# Patient Record
Sex: Female | Born: 1945 | Race: White | Hispanic: No | Marital: Married | Smoking: Never smoker
Health system: Southern US, Community
[De-identification: ages and names within clinical notes are randomized; demographics above are authoritative.]

## PROBLEM LIST (undated history)

## (undated) DIAGNOSIS — M1990 Unspecified osteoarthritis, unspecified site: Secondary | ICD-10-CM

## (undated) DIAGNOSIS — Z803 Family history of malignant neoplasm of breast: Secondary | ICD-10-CM

## (undated) DIAGNOSIS — E0500 Thyrotoxicosis with diffuse goiter without thyrotoxic crisis or storm: Secondary | ICD-10-CM

## (undated) HISTORY — PX: SCLERAL BUCKLE: SHX5340

## (undated) HISTORY — PX: CATARACT EXTRACTION: SUR2

## (undated) HISTORY — PX: TUBAL LIGATION: SHX77

## (undated) HISTORY — DX: Thyrotoxicosis with diffuse goiter without thyrotoxic crisis or storm: E05.00

## (undated) HISTORY — PX: RETINAL DETACHMENT SURGERY: SHX105

## (undated) HISTORY — DX: Family history of malignant neoplasm of breast: Z80.3

---

## 1988-07-13 HISTORY — PX: BREAST EXCISIONAL BIOPSY: SUR124

## 1996-02-15 DIAGNOSIS — D229 Melanocytic nevi, unspecified: Secondary | ICD-10-CM

## 1996-02-15 HISTORY — DX: Melanocytic nevi, unspecified: D22.9

## 1997-08-17 ENCOUNTER — Ambulatory Visit (HOSPITAL_COMMUNITY): Admission: RE | Admit: 1997-08-17 | Discharge: 1997-08-17 | Payer: Self-pay | Admitting: General Surgery

## 1997-08-17 HISTORY — PX: BREAST BIOPSY: SHX20

## 1998-08-22 ENCOUNTER — Other Ambulatory Visit: Admission: RE | Admit: 1998-08-22 | Discharge: 1998-08-22 | Payer: Self-pay | Admitting: Obstetrics and Gynecology

## 1999-08-05 ENCOUNTER — Other Ambulatory Visit: Admission: RE | Admit: 1999-08-05 | Discharge: 1999-08-05 | Payer: Self-pay | Admitting: Obstetrics and Gynecology

## 1999-11-11 ENCOUNTER — Encounter: Admission: RE | Admit: 1999-11-11 | Discharge: 1999-11-11 | Payer: Self-pay | Admitting: Obstetrics and Gynecology

## 1999-11-11 ENCOUNTER — Encounter: Payer: Self-pay | Admitting: Obstetrics and Gynecology

## 2000-05-13 ENCOUNTER — Encounter: Payer: Self-pay | Admitting: Ophthalmology

## 2000-05-13 ENCOUNTER — Ambulatory Visit (HOSPITAL_COMMUNITY): Admission: RE | Admit: 2000-05-13 | Discharge: 2000-05-14 | Payer: Self-pay | Admitting: Ophthalmology

## 2000-08-05 ENCOUNTER — Other Ambulatory Visit: Admission: RE | Admit: 2000-08-05 | Discharge: 2000-08-05 | Payer: Self-pay | Admitting: Obstetrics and Gynecology

## 2000-11-30 ENCOUNTER — Encounter: Payer: Self-pay | Admitting: Obstetrics and Gynecology

## 2000-11-30 ENCOUNTER — Encounter: Admission: RE | Admit: 2000-11-30 | Discharge: 2000-11-30 | Payer: Self-pay | Admitting: Obstetrics and Gynecology

## 2000-12-02 ENCOUNTER — Encounter: Payer: Self-pay | Admitting: General Surgery

## 2000-12-02 ENCOUNTER — Encounter: Admission: RE | Admit: 2000-12-02 | Discharge: 2000-12-02 | Payer: Self-pay | Admitting: General Surgery

## 2000-12-03 ENCOUNTER — Encounter: Payer: Self-pay | Admitting: General Surgery

## 2000-12-03 ENCOUNTER — Encounter: Admission: RE | Admit: 2000-12-03 | Discharge: 2000-12-03 | Payer: Self-pay | Admitting: General Surgery

## 2001-10-04 ENCOUNTER — Other Ambulatory Visit: Admission: RE | Admit: 2001-10-04 | Discharge: 2001-10-04 | Payer: Self-pay | Admitting: Obstetrics and Gynecology

## 2001-12-07 ENCOUNTER — Encounter: Payer: Self-pay | Admitting: Obstetrics and Gynecology

## 2001-12-07 ENCOUNTER — Encounter: Admission: RE | Admit: 2001-12-07 | Discharge: 2001-12-07 | Payer: Self-pay | Admitting: Obstetrics and Gynecology

## 2002-01-05 ENCOUNTER — Ambulatory Visit (HOSPITAL_COMMUNITY): Admission: RE | Admit: 2002-01-05 | Discharge: 2002-01-05 | Payer: Self-pay | Admitting: Gastroenterology

## 2002-06-01 ENCOUNTER — Encounter: Admission: RE | Admit: 2002-06-01 | Discharge: 2002-06-01 | Payer: Self-pay | Admitting: Obstetrics and Gynecology

## 2002-06-01 ENCOUNTER — Encounter: Payer: Self-pay | Admitting: Obstetrics and Gynecology

## 2002-10-10 ENCOUNTER — Other Ambulatory Visit: Admission: RE | Admit: 2002-10-10 | Discharge: 2002-10-10 | Payer: Self-pay | Admitting: Obstetrics and Gynecology

## 2002-11-09 ENCOUNTER — Encounter: Admission: RE | Admit: 2002-11-09 | Discharge: 2002-11-09 | Payer: Self-pay | Admitting: Geriatric Medicine

## 2002-11-09 ENCOUNTER — Encounter: Payer: Self-pay | Admitting: Geriatric Medicine

## 2002-12-15 ENCOUNTER — Encounter: Admission: RE | Admit: 2002-12-15 | Discharge: 2002-12-15 | Payer: Self-pay | Admitting: Obstetrics and Gynecology

## 2002-12-15 ENCOUNTER — Encounter: Payer: Self-pay | Admitting: Obstetrics and Gynecology

## 2003-12-19 ENCOUNTER — Encounter: Admission: RE | Admit: 2003-12-19 | Discharge: 2003-12-19 | Payer: Self-pay | Admitting: Obstetrics and Gynecology

## 2005-01-15 ENCOUNTER — Encounter: Admission: RE | Admit: 2005-01-15 | Discharge: 2005-01-15 | Payer: Self-pay | Admitting: Obstetrics and Gynecology

## 2006-01-19 ENCOUNTER — Ambulatory Visit (HOSPITAL_COMMUNITY): Admission: RE | Admit: 2006-01-19 | Discharge: 2006-01-19 | Payer: Self-pay | Admitting: Obstetrics and Gynecology

## 2006-01-21 ENCOUNTER — Encounter: Admission: RE | Admit: 2006-01-21 | Discharge: 2006-01-21 | Payer: Self-pay | Admitting: Obstetrics and Gynecology

## 2006-06-08 ENCOUNTER — Encounter: Admission: RE | Admit: 2006-06-08 | Discharge: 2006-06-08 | Payer: Self-pay | Admitting: Obstetrics and Gynecology

## 2006-06-11 ENCOUNTER — Encounter: Admission: RE | Admit: 2006-06-11 | Discharge: 2006-06-11 | Payer: Self-pay | Admitting: Geriatric Medicine

## 2007-01-28 ENCOUNTER — Encounter: Admission: RE | Admit: 2007-01-28 | Discharge: 2007-01-28 | Payer: Self-pay | Admitting: Obstetrics and Gynecology

## 2007-08-09 ENCOUNTER — Ambulatory Visit: Payer: Self-pay | Admitting: Oncology

## 2008-02-10 ENCOUNTER — Encounter: Admission: RE | Admit: 2008-02-10 | Discharge: 2008-02-10 | Payer: Self-pay | Admitting: Obstetrics and Gynecology

## 2008-04-05 ENCOUNTER — Encounter: Admission: RE | Admit: 2008-04-05 | Discharge: 2008-04-05 | Payer: Self-pay | Admitting: Geriatric Medicine

## 2009-02-21 ENCOUNTER — Encounter: Admission: RE | Admit: 2009-02-21 | Discharge: 2009-02-21 | Payer: Self-pay | Admitting: Obstetrics and Gynecology

## 2009-10-23 ENCOUNTER — Encounter: Admission: RE | Admit: 2009-10-23 | Discharge: 2009-10-23 | Payer: Self-pay | Admitting: Orthopedic Surgery

## 2009-11-04 ENCOUNTER — Encounter: Admission: RE | Admit: 2009-11-04 | Discharge: 2009-11-04 | Payer: Self-pay | Admitting: Orthopedic Surgery

## 2010-03-04 ENCOUNTER — Encounter: Admission: RE | Admit: 2010-03-04 | Discharge: 2010-03-04 | Payer: Self-pay | Admitting: Geriatric Medicine

## 2010-04-01 ENCOUNTER — Encounter: Admission: RE | Admit: 2010-04-01 | Discharge: 2010-04-01 | Payer: Self-pay | Admitting: Obstetrics and Gynecology

## 2010-08-03 ENCOUNTER — Encounter: Payer: Self-pay | Admitting: Obstetrics and Gynecology

## 2010-11-28 NOTE — Discharge Summary (Signed)
[place]. [facility]  Patient:    NECIA, KAMM                       MRN: [id number] Adm. Date:  [id number] Disc. Date: [id number] Attending:  Ivor Messier CC:         Marcelyn Bruins. Nile Riggs, M.D.   Discharge Summary  This was a planned outpatient admission of this 65 year old white female admitted with chronic and recurrent vitreous hemorrhage associated with retinal tears of the right eye (see detailed admission history and physical).  HOSPITAL COURSE:  The patient was evaluated preoperatively and felt to be in satisfactory condition for the proposed surgery.  She therefore was taken into the operating room where a combined posterior vitrectomy and scleral buckling procedure was performed under general anesthesia without complication.  The patient tolerated the two-hour procedure under general anesthesia and was taken to the recovery room and subsequently to the 23-hour observation unit. The patient was seen on the evening of surgery and the following morning and felt to be doing well.  Inspection of the eye revealed a clear vitreous, retinal attachment with a good superior buckling indentation.  It was felt that the patient had achieved maximal hospital benefit, that she could be discharged from the hospital to be followed in the office on limited activity. The patient and her family were given a printed list of discharge instruction on the care and use of the operated eye.  Discharge ocular medications include Cyclogyl, Cyclomydril ophthalmic solution, Tobradex ophthalmic solution one drop four times a day five minutes apart; Maxitrol and atropine ointment at bedtime.  Condition on discharge improved.  DISCHARGE DIAGNOSIS:  Chronic and recurrent vitreous hemorrhage, left eye. Multiple retinal tears, left eye. DD:  06/14/00 TD:  06/14/00 Job: [id number]

## 2010-11-28 NOTE — Procedures (Signed)
[place]. [facility] [place]  Patient:    Renee Smith, Renee Smith Visit Number: [id number] MRN: [id number]          Service Type: END Location: ENDO Attending Physician:  Dennison Bulla Ii Dictated by:   Verlin Grills, M.D. Proc. Date: 01/05/02 Admit Date:  01/05/2002 Discharge Date: 01/05/2002   CC:         Hal T. Stoneking, M.D.  Sherry A. Rosalio Macadamia, M.D.   Procedure Report  REFERRING PHYSICIANS:  Hal T. Pete Glatter, M.D., Eliberto Ivory. Rosalio Macadamia, M.D.  PROCEDURE:  Colonoscopy.  PROCEDURE INDICATION:  Ms. [name] is a 65 year old female born [birth year].  Ms. [name] has intermittent hematochezia and is undergoing diagnostic colonoscopy.  I discussed with Ms. [name] the complications associated with colonoscopy and polypectomy, including a 15 per thousand risk of bleeding and four per thousand risk of colon perforation.  Ms. [name] has signed the permit.  ENDOSCOPIST:  Verlin Grills, M.D.  PREMEDICATION:  Versed 10 mg, Demerol 50 mg.  ENDOSCOPE:  Olympus pediatric colonoscope.  DESCRIPTION OF PROCEDURE:  After obtaining informed consent, Ms. [name] was placed in the left lateral decubitus position.  I administered intravenous Demerol and intravenous Versed to achieve conscious sedation for the procedure.  The patients blood pressure, oxygen saturation, and cardiac rhythm were monitored throughout the procedure and documented in the medical record.  Anal inspection normal.  Digital rectal exam normal.  The Olympus pediatric video colonoscope was introduced into the rectum and easily advanced to the cecum.  Colonic preparation for the exam today was excellent.  Rectum normal.  Sigmoid colon and descending colon normal.  Splenic flexure normal.  Transverse colon normal.  Hepatic flexure normal.  Ascending colon normal.  Cecum and ileocecal valve normal.  ASSESSMENT:  Normal proctocolonoscopy to the cecum.  No sign of  lower gastrointestinal bleeding, colorectal neoplasia, or inflammatory bowel disease.  RECOMMENDATIONS:  I suspect Ms. [name] intermittent hematochezia is due to internal hemorrhoidal bleeding.  This should be easily controlled with topical therapy such as Preparation H suppositories or mesalamine suppositories. Dictated by:   Verlin Grills, M.D. Attending Physician:  Dennison Bulla Ii DD:  01/05/02 TD:  01/07/02 Job: [id number]

## 2010-11-28 NOTE — H&P (Signed)
. [facility]  Patient:    Renee Smith, Renee Smith                       MRN: [id number] Adm. Date:  06/10/00 Attending:  Ivor Messier                         History and Physical  REASON FOR HOSPITALIZATION:  This was a planned outpatient surgical admission of this 65 year old white female admitted for retinal detachment surgery of the right eye.  HISTORY OF PRESENT ILLNESS:  This patient has a past history of retinal detachment in the left eye.  She was previously admitted to this hospital in July 1996 and had a scleral buckling procedure of the left eye. Postoperatively the vision returned to 20/30 in the operated eye.  The patient continued to do well until recently, when she developed a dense vitreous hemorrhage of the right eye in September 2001.  Examination revealed a dense vitreous hemorrhage and localized horseshoe retinal tear at the end of a previous cryopexy scar.  Patient was treated with laser photocoagulation around the new tear.  Postoperatively the visual acuity remained poor at hand motion.  There was some clearing, however, and then recurrent vitreous hemorrhage.  It was elected to proceed with a vitrectomy combined with scleral buckling surgery to improve patients hand motion vision.  PAST MEDICAL HISTORY:  Patient is in stable general health.  REVIEW OF SYSTEMS:  No cardiorespiratory complaints.  PHYSICAL EXAMINATION:  VITAL SIGNS:  As recorded on admission, blood pressure 139/88, temperature 98.9, pulse 71, respirations 18.  GENERAL:  The patient is a pleasant, well-nourished, well-developed white female in acute ocular distress.  HEENT:  Eyes:  Visual acuity with best correction, hand motion to 20/200 right eye; 20/30 +2 left eye.  Applanation tonometry normal.  Slit lamp examination: The eyes are white and clear with clear cornea, deep and clear anterior chamber.  Slight lens nuclear sclerosis is present.  Fundus  examination: Dense vitreous hemorrhage, right eye.  Retinal details not well-visualized. Left eye:  Old scleral buckling indentation, retina reattached with normal optic nerve and blood vessels.  CHEST:  Lungs clear to percussion and auscultation.  CARDIAC:  Normal sinus rhythm.  No cardiomegaly, no murmurs.  ABDOMEN:  Negative.  EXTREMITIES:  Negative.  ADMISSION DIAGNOSIS:  Retinal tears, right eye; chronic and recurrent vitreous hemorrhage, right eye.  SURGICAL PLAN:  Scleral buckling procedure combined with posterior vitrectomy. The patient has been given oral discussion and printed information concerning the procedure and its possible complications.  She has signed an informed consent and arrangements made for outpatient admission at this time. DD:  06/10/00 TD:  06/10/00 Job: [id number]

## 2010-11-28 NOTE — Op Note (Signed)
[place]. [facility]  Patient:    ENA, DEMARY                       MRN: [id number] Proc. Date: 06/14/00 Attending:  Ivor Messier CC:         Marcelyn Bruins. Nile Riggs, M.D.   Operative Report  PREOPERATIVE DIAGNOSIS:  Vitreous hemorrhage, retinal tears, right eye.  POSTOPERATIVE DIAGNOSIS:  OPERATION PERFORMED:  Posterior vitrectomy through pars plana using vitreous infusion suction cutter.  Scleral buckling procedure using solid silicone implants #277 and 240.  SURGEON:  Guadelupe Sabin, M.D.  ASSISTANT:  Nurse.  ANESTHESIA:  General.  OPHTHALMOSCOPY:  No preop view due to dense chronic vitreous hemorrhage.  DESCRIPTION OF PROCEDURE:  After the patient was prepped and draped, a lid speculum and lid traction sutures were placed in the right eye.  A peritomy was performed adjacent to the limbus 360 degrees.  The subconjunctival tissue was cleaned and the rectus muscles looped with 4-0 silk traction sutures.  The sclera was inspected and felt to be in satisfactory condition for lamellar scleral dissection.  Lamellar scleral dissection was then carried out from an area from the 10 to 2 oclock position.  The bed measuring 9 mm in width. Light diathermy applications were applied to the inner scleral lamella.  A total of four 4-0 green Mersilene sutures were loosely placed over the scleral flaps and #277 trimmed implant.  A #240 solid silicone encircling band was placed about the globe and anchored at the equator with anchoring sutures at the 8 and 4 oclock position.  The band itself was tied with a 4-0 green Mersilene suture at the 7:30 position.  The occular microscope was then aligned and three sclerotomy sites prepared at the 10, 2 and 8 oclock position of the right eye.  The vitreous infusion terminal was secured in place at the 8 oclock position with a 5-0 white mattress Dacron suture.  The fiberoptic light pipe was inserted at the 2 oclock  position and the hand piece vitreous infusion suction cautery at the 10 oclock position.  Slow vitreous infusion, suction cutting were begun from an anterior to posterior direction.  Dense vitreous hemorrhage and fibrillar vitreous degeneration is present with new and old vitreous hemorrhage.  Gradually, the vitreous cleared and the retina could be seen.  The optic nerve was sharply outlined with good color, disk-cup ratio 0.4.  The blood vessels and macula appeared normal. Inspection of the peripheral retina revealed the previous retinal tears superiorly.  It was then elected to perform a temporary air-fluid exchange. This allowed the tension of the encircling band to be adjusted and the scleral flaps to be closed permanently.  The air was then removed and the saline reintroduced.  A good buckling indentation was created and the retinal tears appeared to be in satisfactory condition although the one at the 11:30 position appeared to be close to the posterior edge of the implant.  No open breaks were noticed.  It was therefore elected to close.  The vitreous infusion instruments were withdrawn and the tension of the encircling band adjusted permanently.  Tenons capsule was pulled forward in the four quadrants and tied as a separate layer after the sclerotomy sites had been closed with 7-0 Vicryl interrupted sutures.  The conjunctiva was then pulled forward and closed with a running 7-0 Vicryl suture.  Depo-Garamycin and Celestone were injected in the subtenons space inferiorly and Maxitrol and atropine  ointment instilled in the conjunctival cul-de-sac.  A light patch and protector shield were applied.  Duration two hours.  The patient tolerated the procedure well in general, left the operating room for the recovery room and subsequently to the 23-hour observation unit. DD:  06/14/00 TD:  06/14/00 Job: [id number]

## 2011-02-17 ENCOUNTER — Other Ambulatory Visit: Payer: Self-pay | Admitting: Geriatric Medicine

## 2011-02-17 DIAGNOSIS — E041 Nontoxic single thyroid nodule: Secondary | ICD-10-CM

## 2011-02-24 ENCOUNTER — Ambulatory Visit
Admission: RE | Admit: 2011-02-24 | Discharge: 2011-02-24 | Disposition: A | Discharge status: Home / Self Care | Payer: BC Managed Care – PPO | Source: Ambulatory Visit | Attending: Geriatric Medicine | Admitting: Geriatric Medicine

## 2011-02-24 DIAGNOSIS — E041 Nontoxic single thyroid nodule: Secondary | ICD-10-CM

## 2011-03-03 ENCOUNTER — Other Ambulatory Visit: Payer: Self-pay | Admitting: Geriatric Medicine

## 2011-03-03 DIAGNOSIS — Z1231 Encounter for screening mammogram for malignant neoplasm of breast: Secondary | ICD-10-CM

## 2011-04-16 ENCOUNTER — Ambulatory Visit
Admission: RE | Admit: 2011-04-16 | Discharge: 2011-04-16 | Disposition: A | Discharge status: Home / Self Care | Payer: BC Managed Care – PPO | Source: Ambulatory Visit | Attending: Geriatric Medicine | Admitting: Geriatric Medicine

## 2011-04-16 DIAGNOSIS — Z1231 Encounter for screening mammogram for malignant neoplasm of breast: Secondary | ICD-10-CM

## 2012-01-28 DIAGNOSIS — H903 Sensorineural hearing loss, bilateral: Secondary | ICD-10-CM | POA: Diagnosis not present

## 2012-02-19 ENCOUNTER — Other Ambulatory Visit: Payer: Self-pay | Admitting: Geriatric Medicine

## 2012-02-19 DIAGNOSIS — Z23 Encounter for immunization: Secondary | ICD-10-CM | POA: Diagnosis not present

## 2012-02-19 DIAGNOSIS — Z0000 Encounter for general adult medical examination without abnormal findings: Secondary | ICD-10-CM | POA: Diagnosis not present

## 2012-02-19 DIAGNOSIS — Z79899 Other long term (current) drug therapy: Secondary | ICD-10-CM | POA: Diagnosis not present

## 2012-02-19 DIAGNOSIS — E041 Nontoxic single thyroid nodule: Secondary | ICD-10-CM | POA: Diagnosis not present

## 2012-02-19 DIAGNOSIS — R030 Elevated blood-pressure reading, without diagnosis of hypertension: Secondary | ICD-10-CM | POA: Diagnosis not present

## 2012-02-19 DIAGNOSIS — E7800 Pure hypercholesterolemia, unspecified: Secondary | ICD-10-CM | POA: Diagnosis not present

## 2012-02-24 ENCOUNTER — Ambulatory Visit
Admission: RE | Admit: 2012-02-24 | Discharge: 2012-02-24 | Disposition: A | Discharge status: Home / Self Care | Payer: Medicare Other | Source: Ambulatory Visit | Attending: Geriatric Medicine | Admitting: Geriatric Medicine

## 2012-02-24 ENCOUNTER — Other Ambulatory Visit: Payer: Self-pay | Admitting: Geriatric Medicine

## 2012-02-24 DIAGNOSIS — E041 Nontoxic single thyroid nodule: Secondary | ICD-10-CM

## 2012-02-24 DIAGNOSIS — E042 Nontoxic multinodular goiter: Secondary | ICD-10-CM | POA: Diagnosis not present

## 2012-03-02 ENCOUNTER — Other Ambulatory Visit (HOSPITAL_COMMUNITY)
Admission: RE | Admit: 2012-03-02 | Discharge: 2012-03-02 | Disposition: A | Discharge status: Home / Self Care | Payer: Medicare Other | Source: Ambulatory Visit | Attending: Diagnostic Radiology | Admitting: Diagnostic Radiology

## 2012-03-02 ENCOUNTER — Ambulatory Visit
Admission: RE | Admit: 2012-03-02 | Discharge: 2012-03-02 | Disposition: A | Discharge status: Home / Self Care | Payer: Medicare Other | Source: Ambulatory Visit | Attending: Geriatric Medicine | Admitting: Geriatric Medicine

## 2012-03-02 ENCOUNTER — Telehealth: Payer: Self-pay | Admitting: Radiology

## 2012-03-02 DIAGNOSIS — E049 Nontoxic goiter, unspecified: Secondary | ICD-10-CM | POA: Diagnosis not present

## 2012-03-02 DIAGNOSIS — E041 Nontoxic single thyroid nodule: Secondary | ICD-10-CM | POA: Diagnosis not present

## 2012-03-02 NOTE — Telephone Encounter (Signed)
Follow up call.  Pt here earlier today for US Guided FNA Thyroid Biopsy of Left Lobe thyroid Nodule.    Questionable small hematoma just superficial to thyroid biopsy site immediately post Bx.  Ice pack applied.  Seen by Dr Lowella Dandy & Patient observed x 15 minutes w/ decrease in size of small hematoma.   Given verbal discharge instructions by Dr Lowella Dandy.   Presently, patient states that biopsy site feels swollen. However, denies problems w/ swallowing or with respirations.   Has reapplied ice pack x 1.    Instructed patient to continue use of ice pack as needed during the remainder of today.  Also, instructed to call for any additional questions or change in symptoms.  Given IR extension [phone].

## 2012-03-31 ENCOUNTER — Other Ambulatory Visit: Payer: Self-pay | Admitting: Urology

## 2012-03-31 DIAGNOSIS — Z1231 Encounter for screening mammogram for malignant neoplasm of breast: Secondary | ICD-10-CM

## 2012-04-01 DIAGNOSIS — Z961 Presence of intraocular lens: Secondary | ICD-10-CM | POA: Diagnosis not present

## 2012-04-29 ENCOUNTER — Ambulatory Visit
Admission: RE | Admit: 2012-04-29 | Discharge: 2012-04-29 | Disposition: A | Discharge status: Home / Self Care | Payer: Medicare Other | Source: Ambulatory Visit | Attending: Urology | Admitting: Urology

## 2012-04-29 DIAGNOSIS — Z1231 Encounter for screening mammogram for malignant neoplasm of breast: Secondary | ICD-10-CM

## 2012-05-18 DIAGNOSIS — Z1211 Encounter for screening for malignant neoplasm of colon: Secondary | ICD-10-CM | POA: Diagnosis not present

## 2012-05-26 DIAGNOSIS — Z124 Encounter for screening for malignant neoplasm of cervix: Secondary | ICD-10-CM | POA: Diagnosis not present

## 2012-05-26 DIAGNOSIS — R87610 Atypical squamous cells of undetermined significance on cytologic smear of cervix (ASC-US): Secondary | ICD-10-CM | POA: Diagnosis not present

## 2012-05-26 DIAGNOSIS — Z01419 Encounter for gynecological examination (general) (routine) without abnormal findings: Secondary | ICD-10-CM | POA: Diagnosis not present

## 2012-08-19 DIAGNOSIS — Z79899 Other long term (current) drug therapy: Secondary | ICD-10-CM | POA: Diagnosis not present

## 2012-08-19 DIAGNOSIS — R030 Elevated blood-pressure reading, without diagnosis of hypertension: Secondary | ICD-10-CM | POA: Diagnosis not present

## 2012-08-19 DIAGNOSIS — R51 Headache: Secondary | ICD-10-CM | POA: Diagnosis not present

## 2012-08-19 DIAGNOSIS — M545 Low back pain: Secondary | ICD-10-CM | POA: Diagnosis not present

## 2013-02-02 DIAGNOSIS — H903 Sensorineural hearing loss, bilateral: Secondary | ICD-10-CM | POA: Diagnosis not present

## 2013-02-21 ENCOUNTER — Other Ambulatory Visit: Payer: Self-pay | Admitting: Geriatric Medicine

## 2013-02-21 DIAGNOSIS — Z79899 Other long term (current) drug therapy: Secondary | ICD-10-CM | POA: Diagnosis not present

## 2013-02-21 DIAGNOSIS — E7800 Pure hypercholesterolemia, unspecified: Secondary | ICD-10-CM | POA: Diagnosis not present

## 2013-02-21 DIAGNOSIS — E041 Nontoxic single thyroid nodule: Secondary | ICD-10-CM | POA: Diagnosis not present

## 2013-02-21 DIAGNOSIS — Z0000 Encounter for general adult medical examination without abnormal findings: Secondary | ICD-10-CM | POA: Diagnosis not present

## 2013-02-21 DIAGNOSIS — Z1331 Encounter for screening for depression: Secondary | ICD-10-CM | POA: Diagnosis not present

## 2013-02-28 ENCOUNTER — Ambulatory Visit
Admission: RE | Admit: 2013-02-28 | Discharge: 2013-02-28 | Disposition: A | Discharge status: Home / Self Care | Payer: Medicare Other | Source: Ambulatory Visit | Attending: Geriatric Medicine | Admitting: Geriatric Medicine

## 2013-02-28 DIAGNOSIS — J984 Other disorders of lung: Secondary | ICD-10-CM | POA: Diagnosis not present

## 2013-02-28 DIAGNOSIS — E041 Nontoxic single thyroid nodule: Secondary | ICD-10-CM

## 2013-03-01 ENCOUNTER — Other Ambulatory Visit: Payer: Medicare Other

## 2013-04-10 DIAGNOSIS — Z961 Presence of intraocular lens: Secondary | ICD-10-CM | POA: Diagnosis not present

## 2013-04-18 DIAGNOSIS — Z23 Encounter for immunization: Secondary | ICD-10-CM | POA: Diagnosis not present

## 2013-05-01 ENCOUNTER — Other Ambulatory Visit: Payer: Self-pay

## 2013-05-01 ENCOUNTER — Other Ambulatory Visit: Payer: Self-pay | Admitting: Obstetrics and Gynecology

## 2013-05-01 DIAGNOSIS — Z1231 Encounter for screening mammogram for malignant neoplasm of breast: Secondary | ICD-10-CM

## 2013-05-01 DIAGNOSIS — M810 Age-related osteoporosis without current pathological fracture: Secondary | ICD-10-CM

## 2013-05-09 DIAGNOSIS — L821 Other seborrheic keratosis: Secondary | ICD-10-CM | POA: Diagnosis not present

## 2013-05-09 DIAGNOSIS — D239 Other benign neoplasm of skin, unspecified: Secondary | ICD-10-CM | POA: Diagnosis not present

## 2013-05-29 ENCOUNTER — Other Ambulatory Visit: Payer: Medicare Other

## 2013-05-29 ENCOUNTER — Ambulatory Visit: Payer: Medicare Other

## 2013-06-01 ENCOUNTER — Ambulatory Visit
Admission: RE | Admit: 2013-06-01 | Discharge: 2013-06-01 | Disposition: A | Discharge status: Home / Self Care | Payer: Medicare Other | Source: Ambulatory Visit

## 2013-06-01 ENCOUNTER — Ambulatory Visit
Admission: RE | Admit: 2013-06-01 | Discharge: 2013-06-01 | Disposition: A | Discharge status: Home / Self Care | Payer: Medicare Other | Source: Ambulatory Visit | Attending: Obstetrics and Gynecology | Admitting: Obstetrics and Gynecology

## 2013-06-01 DIAGNOSIS — M810 Age-related osteoporosis without current pathological fracture: Secondary | ICD-10-CM

## 2013-06-01 DIAGNOSIS — M899 Disorder of bone, unspecified: Secondary | ICD-10-CM | POA: Diagnosis not present

## 2013-06-01 DIAGNOSIS — Z1231 Encounter for screening mammogram for malignant neoplasm of breast: Secondary | ICD-10-CM | POA: Diagnosis not present

## 2013-06-17 DIAGNOSIS — IMO0002 Reserved for concepts with insufficient information to code with codable children: Secondary | ICD-10-CM | POA: Diagnosis not present

## 2013-07-04 DIAGNOSIS — IMO0002 Reserved for concepts with insufficient information to code with codable children: Secondary | ICD-10-CM | POA: Diagnosis not present

## 2013-07-10 DIAGNOSIS — M1710 Unilateral primary osteoarthritis, unspecified knee: Secondary | ICD-10-CM | POA: Diagnosis not present

## 2013-07-19 DIAGNOSIS — M810 Age-related osteoporosis without current pathological fracture: Secondary | ICD-10-CM | POA: Diagnosis not present

## 2013-07-19 DIAGNOSIS — N952 Postmenopausal atrophic vaginitis: Secondary | ICD-10-CM | POA: Diagnosis not present

## 2013-07-19 DIAGNOSIS — Z124 Encounter for screening for malignant neoplasm of cervix: Secondary | ICD-10-CM | POA: Diagnosis not present

## 2013-07-19 DIAGNOSIS — Z01419 Encounter for gynecological examination (general) (routine) without abnormal findings: Secondary | ICD-10-CM | POA: Diagnosis not present

## 2013-07-24 DIAGNOSIS — M9420 Chondromalacia, unspecified site: Secondary | ICD-10-CM | POA: Diagnosis not present

## 2013-07-24 DIAGNOSIS — M23305 Other meniscus derangements, unspecified medial meniscus, unspecified knee: Secondary | ICD-10-CM | POA: Diagnosis not present

## 2013-07-24 DIAGNOSIS — S83289A Other tear of lateral meniscus, current injury, unspecified knee, initial encounter: Secondary | ICD-10-CM | POA: Diagnosis not present

## 2013-07-24 DIAGNOSIS — M23302 Other meniscus derangements, unspecified lateral meniscus, unspecified knee: Secondary | ICD-10-CM | POA: Diagnosis not present

## 2013-07-24 DIAGNOSIS — M659 Synovitis and tenosynovitis, unspecified: Secondary | ICD-10-CM | POA: Diagnosis not present

## 2013-07-24 DIAGNOSIS — M2240 Chondromalacia patellae, unspecified knee: Secondary | ICD-10-CM | POA: Diagnosis not present

## 2013-07-24 DIAGNOSIS — IMO0002 Reserved for concepts with insufficient information to code with codable children: Secondary | ICD-10-CM | POA: Diagnosis not present

## 2013-08-23 DIAGNOSIS — M545 Low back pain, unspecified: Secondary | ICD-10-CM | POA: Diagnosis not present

## 2013-08-23 DIAGNOSIS — Z79899 Other long term (current) drug therapy: Secondary | ICD-10-CM | POA: Diagnosis not present

## 2013-11-21 DIAGNOSIS — IMO0002 Reserved for concepts with insufficient information to code with codable children: Secondary | ICD-10-CM | POA: Diagnosis not present

## 2013-11-21 DIAGNOSIS — S83289A Other tear of lateral meniscus, current injury, unspecified knee, initial encounter: Secondary | ICD-10-CM | POA: Diagnosis not present

## 2014-04-13 ENCOUNTER — Other Ambulatory Visit: Payer: Self-pay | Admitting: Geriatric Medicine

## 2014-04-13 DIAGNOSIS — E041 Nontoxic single thyroid nodule: Secondary | ICD-10-CM

## 2014-04-13 DIAGNOSIS — E669 Obesity, unspecified: Secondary | ICD-10-CM | POA: Diagnosis not present

## 2014-04-13 DIAGNOSIS — R3919 Other difficulties with micturition: Secondary | ICD-10-CM | POA: Diagnosis not present

## 2014-04-13 DIAGNOSIS — Z1389 Encounter for screening for other disorder: Secondary | ICD-10-CM | POA: Diagnosis not present

## 2014-04-13 DIAGNOSIS — Z0000 Encounter for general adult medical examination without abnormal findings: Secondary | ICD-10-CM | POA: Diagnosis not present

## 2014-04-13 DIAGNOSIS — M8580 Other specified disorders of bone density and structure, unspecified site: Secondary | ICD-10-CM | POA: Diagnosis not present

## 2014-04-13 DIAGNOSIS — E780 Pure hypercholesterolemia: Secondary | ICD-10-CM | POA: Diagnosis not present

## 2014-04-13 DIAGNOSIS — Z79899 Other long term (current) drug therapy: Secondary | ICD-10-CM | POA: Diagnosis not present

## 2014-04-26 ENCOUNTER — Ambulatory Visit
Admission: RE | Admit: 2014-04-26 | Discharge: 2014-04-26 | Disposition: A | Discharge status: Home / Self Care | Payer: Medicare Other | Source: Ambulatory Visit | Attending: Geriatric Medicine | Admitting: Geriatric Medicine

## 2014-04-26 DIAGNOSIS — E041 Nontoxic single thyroid nodule: Secondary | ICD-10-CM

## 2014-04-26 DIAGNOSIS — E042 Nontoxic multinodular goiter: Secondary | ICD-10-CM | POA: Diagnosis not present

## 2014-04-30 DIAGNOSIS — R3919 Other difficulties with micturition: Secondary | ICD-10-CM | POA: Diagnosis not present

## 2014-06-04 ENCOUNTER — Other Ambulatory Visit: Payer: Self-pay

## 2014-06-04 DIAGNOSIS — Z1231 Encounter for screening mammogram for malignant neoplasm of breast: Secondary | ICD-10-CM

## 2014-06-29 ENCOUNTER — Ambulatory Visit
Admission: RE | Admit: 2014-06-29 | Discharge: 2014-06-29 | Disposition: A | Discharge status: Home / Self Care | Payer: Medicare Other | Source: Ambulatory Visit

## 2014-06-29 DIAGNOSIS — Z1231 Encounter for screening mammogram for malignant neoplasm of breast: Secondary | ICD-10-CM | POA: Diagnosis not present

## 2014-07-04 DIAGNOSIS — Z79899 Other long term (current) drug therapy: Secondary | ICD-10-CM | POA: Diagnosis not present

## 2014-07-04 DIAGNOSIS — Z6830 Body mass index (BMI) 30.0-30.9, adult: Secondary | ICD-10-CM | POA: Diagnosis not present

## 2014-07-04 DIAGNOSIS — E780 Pure hypercholesterolemia: Secondary | ICD-10-CM | POA: Diagnosis not present

## 2014-07-04 DIAGNOSIS — E669 Obesity, unspecified: Secondary | ICD-10-CM | POA: Diagnosis not present

## 2014-07-11 ENCOUNTER — Other Ambulatory Visit: Payer: Self-pay | Admitting: Dermatology

## 2014-07-11 DIAGNOSIS — L820 Inflamed seborrheic keratosis: Secondary | ICD-10-CM | POA: Diagnosis not present

## 2014-07-11 DIAGNOSIS — D485 Neoplasm of uncertain behavior of skin: Secondary | ICD-10-CM | POA: Diagnosis not present

## 2014-07-11 DIAGNOSIS — L821 Other seborrheic keratosis: Secondary | ICD-10-CM | POA: Diagnosis not present

## 2014-07-11 DIAGNOSIS — L309 Dermatitis, unspecified: Secondary | ICD-10-CM | POA: Diagnosis not present

## 2014-07-11 DIAGNOSIS — D239 Other benign neoplasm of skin, unspecified: Secondary | ICD-10-CM | POA: Diagnosis not present

## 2014-10-01 DIAGNOSIS — Z961 Presence of intraocular lens: Secondary | ICD-10-CM | POA: Diagnosis not present

## 2015-01-28 ENCOUNTER — Encounter: Payer: Self-pay | Admitting: Genetic Counselor

## 2015-05-27 DIAGNOSIS — Z23 Encounter for immunization: Secondary | ICD-10-CM | POA: Diagnosis not present

## 2015-05-27 DIAGNOSIS — Z79899 Other long term (current) drug therapy: Secondary | ICD-10-CM | POA: Diagnosis not present

## 2015-05-27 DIAGNOSIS — Z0000 Encounter for general adult medical examination without abnormal findings: Secondary | ICD-10-CM | POA: Diagnosis not present

## 2015-05-27 DIAGNOSIS — E669 Obesity, unspecified: Secondary | ICD-10-CM | POA: Diagnosis not present

## 2015-05-27 DIAGNOSIS — Z1389 Encounter for screening for other disorder: Secondary | ICD-10-CM | POA: Diagnosis not present

## 2015-05-27 DIAGNOSIS — Z6830 Body mass index (BMI) 30.0-30.9, adult: Secondary | ICD-10-CM | POA: Diagnosis not present

## 2015-05-27 DIAGNOSIS — M542 Cervicalgia: Secondary | ICD-10-CM | POA: Diagnosis not present

## 2015-06-14 ENCOUNTER — Other Ambulatory Visit: Payer: Self-pay

## 2015-06-14 DIAGNOSIS — Z1231 Encounter for screening mammogram for malignant neoplasm of breast: Secondary | ICD-10-CM

## 2015-07-19 ENCOUNTER — Ambulatory Visit
Admission: RE | Admit: 2015-07-19 | Discharge: 2015-07-19 | Disposition: A | Discharge status: Home / Self Care | Payer: Medicare Other | Source: Ambulatory Visit

## 2015-07-19 DIAGNOSIS — Z1231 Encounter for screening mammogram for malignant neoplasm of breast: Secondary | ICD-10-CM | POA: Diagnosis not present

## 2016-04-23 DIAGNOSIS — M810 Age-related osteoporosis without current pathological fracture: Secondary | ICD-10-CM | POA: Diagnosis not present

## 2016-04-23 DIAGNOSIS — L293 Anogenital pruritus, unspecified: Secondary | ICD-10-CM | POA: Diagnosis not present

## 2016-04-23 DIAGNOSIS — Z124 Encounter for screening for malignant neoplasm of cervix: Secondary | ICD-10-CM | POA: Diagnosis not present

## 2016-04-23 DIAGNOSIS — Z01419 Encounter for gynecological examination (general) (routine) without abnormal findings: Secondary | ICD-10-CM | POA: Diagnosis not present

## 2016-04-23 DIAGNOSIS — Z23 Encounter for immunization: Secondary | ICD-10-CM | POA: Diagnosis not present

## 2016-04-29 ENCOUNTER — Other Ambulatory Visit: Payer: Self-pay | Admitting: Obstetrics

## 2016-04-29 DIAGNOSIS — M810 Age-related osteoporosis without current pathological fracture: Secondary | ICD-10-CM

## 2016-05-28 ENCOUNTER — Other Ambulatory Visit: Payer: Self-pay | Admitting: Geriatric Medicine

## 2016-05-28 DIAGNOSIS — Z1389 Encounter for screening for other disorder: Secondary | ICD-10-CM | POA: Diagnosis not present

## 2016-05-28 DIAGNOSIS — E041 Nontoxic single thyroid nodule: Secondary | ICD-10-CM

## 2016-05-28 DIAGNOSIS — Z79899 Other long term (current) drug therapy: Secondary | ICD-10-CM | POA: Diagnosis not present

## 2016-05-28 DIAGNOSIS — E7800 Pure hypercholesterolemia, unspecified: Secondary | ICD-10-CM | POA: Diagnosis not present

## 2016-05-28 DIAGNOSIS — M8580 Other specified disorders of bone density and structure, unspecified site: Secondary | ICD-10-CM | POA: Diagnosis not present

## 2016-05-28 DIAGNOSIS — Z0000 Encounter for general adult medical examination without abnormal findings: Secondary | ICD-10-CM | POA: Diagnosis not present

## 2016-06-12 DIAGNOSIS — Z5181 Encounter for therapeutic drug level monitoring: Secondary | ICD-10-CM | POA: Diagnosis not present

## 2016-06-12 DIAGNOSIS — E042 Nontoxic multinodular goiter: Secondary | ICD-10-CM | POA: Diagnosis not present

## 2016-06-12 DIAGNOSIS — R000 Tachycardia, unspecified: Secondary | ICD-10-CM | POA: Diagnosis not present

## 2016-06-12 DIAGNOSIS — E041 Nontoxic single thyroid nodule: Secondary | ICD-10-CM | POA: Diagnosis not present

## 2016-06-12 DIAGNOSIS — R748 Abnormal levels of other serum enzymes: Secondary | ICD-10-CM | POA: Diagnosis not present

## 2016-06-12 DIAGNOSIS — E0590 Thyrotoxicosis, unspecified without thyrotoxic crisis or storm: Secondary | ICD-10-CM | POA: Diagnosis not present

## 2016-06-12 DIAGNOSIS — Z79899 Other long term (current) drug therapy: Secondary | ICD-10-CM | POA: Diagnosis not present

## 2016-06-16 ENCOUNTER — Other Ambulatory Visit: Payer: Medicare Other

## 2016-06-26 ENCOUNTER — Other Ambulatory Visit: Payer: Self-pay | Admitting: Obstetrics

## 2016-06-26 DIAGNOSIS — Z1231 Encounter for screening mammogram for malignant neoplasm of breast: Secondary | ICD-10-CM

## 2016-06-30 DIAGNOSIS — E042 Nontoxic multinodular goiter: Secondary | ICD-10-CM | POA: Diagnosis not present

## 2016-06-30 DIAGNOSIS — E0590 Thyrotoxicosis, unspecified without thyrotoxic crisis or storm: Secondary | ICD-10-CM | POA: Diagnosis not present

## 2016-06-30 DIAGNOSIS — E0500 Thyrotoxicosis with diffuse goiter without thyrotoxic crisis or storm: Secondary | ICD-10-CM | POA: Diagnosis not present

## 2016-06-30 DIAGNOSIS — Z5181 Encounter for therapeutic drug level monitoring: Secondary | ICD-10-CM | POA: Diagnosis not present

## 2016-06-30 DIAGNOSIS — R748 Abnormal levels of other serum enzymes: Secondary | ICD-10-CM | POA: Diagnosis not present

## 2016-07-09 DIAGNOSIS — E0500 Thyrotoxicosis with diffuse goiter without thyrotoxic crisis or storm: Secondary | ICD-10-CM | POA: Diagnosis not present

## 2016-07-09 DIAGNOSIS — E0590 Thyrotoxicosis, unspecified without thyrotoxic crisis or storm: Secondary | ICD-10-CM | POA: Diagnosis not present

## 2016-07-09 DIAGNOSIS — Z5181 Encounter for therapeutic drug level monitoring: Secondary | ICD-10-CM | POA: Diagnosis not present

## 2016-07-21 DIAGNOSIS — E0590 Thyrotoxicosis, unspecified without thyrotoxic crisis or storm: Secondary | ICD-10-CM | POA: Diagnosis not present

## 2016-07-21 DIAGNOSIS — E0500 Thyrotoxicosis with diffuse goiter without thyrotoxic crisis or storm: Secondary | ICD-10-CM | POA: Diagnosis not present

## 2016-07-21 DIAGNOSIS — Z5181 Encounter for therapeutic drug level monitoring: Secondary | ICD-10-CM | POA: Diagnosis not present

## 2016-07-29 ENCOUNTER — Ambulatory Visit: Payer: Medicare Other

## 2016-07-29 ENCOUNTER — Other Ambulatory Visit: Payer: Medicare Other

## 2016-08-18 DIAGNOSIS — E0590 Thyrotoxicosis, unspecified without thyrotoxic crisis or storm: Secondary | ICD-10-CM | POA: Diagnosis not present

## 2016-08-25 ENCOUNTER — Ambulatory Visit
Admission: RE | Admit: 2016-08-25 | Discharge: 2016-08-25 | Disposition: A | Discharge status: Home / Self Care | Payer: Medicare Other | Source: Ambulatory Visit | Attending: Obstetrics | Admitting: Obstetrics

## 2016-08-25 DIAGNOSIS — Z780 Asymptomatic menopausal state: Secondary | ICD-10-CM | POA: Diagnosis not present

## 2016-08-25 DIAGNOSIS — M810 Age-related osteoporosis without current pathological fracture: Secondary | ICD-10-CM | POA: Diagnosis not present

## 2016-08-25 DIAGNOSIS — Z1231 Encounter for screening mammogram for malignant neoplasm of breast: Secondary | ICD-10-CM

## 2016-09-04 DIAGNOSIS — E0500 Thyrotoxicosis with diffuse goiter without thyrotoxic crisis or storm: Secondary | ICD-10-CM | POA: Diagnosis not present

## 2016-09-04 DIAGNOSIS — E0590 Thyrotoxicosis, unspecified without thyrotoxic crisis or storm: Secondary | ICD-10-CM | POA: Diagnosis not present

## 2016-09-04 DIAGNOSIS — E042 Nontoxic multinodular goiter: Secondary | ICD-10-CM | POA: Diagnosis not present

## 2016-09-04 DIAGNOSIS — Z8489 Family history of other specified conditions: Secondary | ICD-10-CM | POA: Diagnosis not present

## 2016-09-04 DIAGNOSIS — Z79899 Other long term (current) drug therapy: Secondary | ICD-10-CM | POA: Diagnosis not present

## 2016-09-04 DIAGNOSIS — Z5181 Encounter for therapeutic drug level monitoring: Secondary | ICD-10-CM | POA: Diagnosis not present

## 2016-09-04 DIAGNOSIS — R748 Abnormal levels of other serum enzymes: Secondary | ICD-10-CM | POA: Diagnosis not present

## 2016-09-09 DIAGNOSIS — M810 Age-related osteoporosis without current pathological fracture: Secondary | ICD-10-CM | POA: Diagnosis not present

## 2016-09-30 DIAGNOSIS — N909 Noninflammatory disorder of vulva and perineum, unspecified: Secondary | ICD-10-CM | POA: Diagnosis not present

## 2016-10-08 DIAGNOSIS — R748 Abnormal levels of other serum enzymes: Secondary | ICD-10-CM | POA: Diagnosis not present

## 2016-10-08 DIAGNOSIS — Z5181 Encounter for therapeutic drug level monitoring: Secondary | ICD-10-CM | POA: Diagnosis not present

## 2016-10-08 DIAGNOSIS — E0590 Thyrotoxicosis, unspecified without thyrotoxic crisis or storm: Secondary | ICD-10-CM | POA: Diagnosis not present

## 2016-10-08 DIAGNOSIS — E0500 Thyrotoxicosis with diffuse goiter without thyrotoxic crisis or storm: Secondary | ICD-10-CM | POA: Diagnosis not present

## 2016-10-14 DIAGNOSIS — N909 Noninflammatory disorder of vulva and perineum, unspecified: Secondary | ICD-10-CM | POA: Diagnosis not present

## 2016-10-14 DIAGNOSIS — N904 Leukoplakia of vulva: Secondary | ICD-10-CM | POA: Diagnosis not present

## 2016-11-16 DIAGNOSIS — E0590 Thyrotoxicosis, unspecified without thyrotoxic crisis or storm: Secondary | ICD-10-CM | POA: Diagnosis not present

## 2016-11-16 DIAGNOSIS — Z5181 Encounter for therapeutic drug level monitoring: Secondary | ICD-10-CM | POA: Diagnosis not present

## 2016-11-16 DIAGNOSIS — E0500 Thyrotoxicosis with diffuse goiter without thyrotoxic crisis or storm: Secondary | ICD-10-CM | POA: Diagnosis not present

## 2016-11-26 DIAGNOSIS — Z8489 Family history of other specified conditions: Secondary | ICD-10-CM | POA: Diagnosis not present

## 2016-11-26 DIAGNOSIS — E0590 Thyrotoxicosis, unspecified without thyrotoxic crisis or storm: Secondary | ICD-10-CM | POA: Diagnosis not present

## 2016-11-26 DIAGNOSIS — E0500 Thyrotoxicosis with diffuse goiter without thyrotoxic crisis or storm: Secondary | ICD-10-CM | POA: Diagnosis not present

## 2016-11-26 DIAGNOSIS — M810 Age-related osteoporosis without current pathological fracture: Secondary | ICD-10-CM | POA: Diagnosis not present

## 2016-11-26 DIAGNOSIS — Z5181 Encounter for therapeutic drug level monitoring: Secondary | ICD-10-CM | POA: Diagnosis not present

## 2016-11-26 DIAGNOSIS — E042 Nontoxic multinodular goiter: Secondary | ICD-10-CM | POA: Diagnosis not present

## 2016-12-15 ENCOUNTER — Encounter: Payer: Self-pay | Admitting: Physical Therapy

## 2016-12-15 ENCOUNTER — Ambulatory Visit: Payer: Medicare Other | Attending: Internal Medicine | Admitting: Physical Therapy

## 2016-12-15 DIAGNOSIS — M6281 Muscle weakness (generalized): Secondary | ICD-10-CM | POA: Diagnosis not present

## 2016-12-15 NOTE — Therapy (Signed)
Renee Smith, [place], [address] Phone: [phone]   Fax:  [phone]  Physical Therapy Evaluation  Patient Details  Name: Renee Smith MRN: [id number] Date of Birth: [birth year] Referring Provider: Sabra Heck, MD  Encounter Date: 12/15/2016      PT End of Session - 12/15/16 1308    Visit Number 1   Number of Visits 9   Date for PT Re-Evaluation 01/15/17   Authorization Type [employer] TRAD- KX at visit 15   PT Start Time 1145   PT Stop Time 1245   PT Time Calculation (min) 60 min   Activity Tolerance Patient tolerated treatment well   Behavior During Therapy Renee Smith for tasks assessed/performed      History reviewed. No pertinent past medical history.  Past Surgical History:  Procedure Laterality Date  . BREAST BIOPSY Right 08/17/1997  . BREAST EXCISIONAL BIOPSY Right 1990    There were no vitals filed for this visit.       Subjective Assessment - 12/15/16 1153    Subjective Occasional LBP off and on. OBGYN told her she was now if full osteoporosis. Goes to the gym 7 days/week and works with trainer. Has to use stairs at home. Used to fall all of the time (as did her Mother) but has been trying to make herself stronger and has not fallen again recently.    Patient Stated Goals reduce risk of fall   Currently in Pain? No/denies            [facility] PT Assessment - 12/15/16 0001      Assessment   Medical Diagnosis osteoporosis   Referring Provider Renee Heck, MD   Onset Date/Surgical Date --  chronic   Prior Therapy no     Precautions   Precaution Comments osteoporosis, history of falls     Restrictions   Weight Bearing Restrictions No     Balance Screen   Has the patient fallen in the past 6 months No     [place] residence   Additional Comments bedroom on second floor     Prior Function   Level of Independence Independent     Cognition   Overall Cognitive Status Within Functional Limits for tasks assessed     Sensation   Additional Comments WFL     ROM / Strength   AROM / PROM / Strength AROM;Strength     AROM   Overall AROM Comments WFL     Strength   Overall Strength Comments gross strength 4/5     High Level Balance   High Level Balance Comments notable A/P instabilty with head turns while standing still- more notable when looking L            Objective measurements completed on examination: See above findings.          [facility] Adult PT Treatment/Exercise - 12/15/16 0001      Exercises   Exercises Knee/Hip     Knee/Hip Exercises: Standing   SLS cues for level pelvis & glut med activation     Knee/Hip Exercises: Supine   Other Supine Knee/Hip Exercises abdominal engagement- supine and seated     Knee/Hip Exercises: Sidelying   Hip ABduction Limitations cues for form and abductor engagement     Knee/Hip Exercises: Prone   Other Prone Exercises planks on forearms and toes  PT Education - Dec 31, 2016 1306    Education provided Yes   Education Details anatomy of condition, POC, HEP, exercise form/rationale, importance of engaging correct muscle groups, force of muscles on bones building density   Person(s) Educated Patient   Methods Explanation;Demonstration;Tactile cues;Verbal cues   Comprehension Verbalized understanding;Returned demonstration;Verbal cues required;Tactile cues required;Need further instruction          PT Short Term Goals - Dec 31, 2016 1315      PT SHORT TERM GOAL #1   Title Pt will demo proper squat form utilizing abdominal wall for lumbar and pelvic support by 7/6   Baseline began educating on core engagement at eval   Time 4   Period Weeks   Status New     PT SHORT TERM GOAL #2   Title Pt will demo high level balance tasks with good balance and independent corrections in LOB   Baseline SBA by PT at eval, pt waits too long to catch  herself   Time 4   Period Weeks   Status New     PT SHORT TERM GOAL #3   Title Pt will demo SLS activities with level pelvis and core engagement   Baseline began educating at eval   Time 4   Period Weeks   Status New     PT SHORT TERM GOAL #4   Title Pt will verbalize improved confidence in challenging strength and balance to continue working after d/c   Baseline fearful of falling at eval   Time 4   Period Weeks   Status New                   Plan - Dec 31, 2016 1308    Clinical Impression Statement Pt presents to PT with referral for osteoporosis program. Pt is very active and was unsure about what PT would do for her. Pt grossly has 4/5 strength with good balance on single leg, notable decrease in A/P balance control with head turns in static stance. Pt is very fearful that she is going to fall and break her hip. Was told by MD that she was at increased risk of falls since her Mother fell. I told the patient that she should not decrease her gym time, rather to make some alterations. I asked her to consider using eliptical or bike rather than treadmill and that I will teach her how to focus her exercises on the appropraite muscle groups for maximum benefit. Pt will also benefit from high level balance training in order to reduce risk of falls during her daily activities.    History and Personal Factors relevant to plan of care: osteoporosis, history of falls   Clinical Presentation Evolving   Clinical Presentation due to: osteoporosis has worsened   Clinical Decision Making Moderate   Rehab Potential Good   PT Frequency 2x / week   PT Duration 4 weeks   PT Treatment/Interventions ADLs/Self Care Home Management;Cryotherapy;Electrical Stimulation;Functional mobility training;Stair training;Gait training;Moist Heat;Therapeutic activities;Therapeutic exercise;Balance training;Neuromuscular re-education;Patient/family education;Passive range of motion;Manual techniques;Taping;Dry  needling   PT Next Visit Plan core engagement in squats, try stepper   PT Home Exercise Plan hip abduction, pelvic tilt-focus on muscular engagement   Consulted and Agree with Plan of Care Patient      Patient will benefit from skilled therapeutic intervention in order to improve the following deficits and impairments:  Decreased knowledge of precautions, Decreased strength  Visit Diagnosis: Muscle weakness (generalized)      G-Codes - 2016/12/31 1546  Functional Assessment Tool Used (Outpatient Only) clinical judgement, gross strength   Functional Limitation Mobility: Walking and moving around   Mobility: Walking and Moving Around Current Status [phone]) At least 20 percent but less than 40 percent impaired, limited or restricted   Mobility: Walking and Moving Around Goal Status [phone]) At least 1 percent but less than 20 percent impaired, limited or restricted       Problem List There are no active problems to display for this patient.   C.  PT, DPT 12/15/16 3:51 PM   [facility] [address] [place], [place], [address] Phone: [phone]   Fax:  [phone]  Name: Renee Smith MRN: [id number] Date of Birth: October 19, 1945

## 2016-12-23 ENCOUNTER — Ambulatory Visit: Payer: Medicare Other | Admitting: Physical Therapy

## 2016-12-23 ENCOUNTER — Encounter: Payer: Self-pay | Admitting: Physical Therapy

## 2016-12-23 DIAGNOSIS — M6281 Muscle weakness (generalized): Secondary | ICD-10-CM

## 2016-12-23 NOTE — Therapy (Signed)
[place], [place], [address] Phone: [phone]   Fax:  [phone]  Physical Therapy Treatment  Patient Details  Name: Renee Smith MRN: [id number] Date of Birth: 11/29/1945 Referring Provider: Sabra Heck, MD  Encounter Date: 12/23/2016      PT End of Session - 12/23/16 0848    Visit Number 2   Number of Visits 9   Date for PT Re-Evaluation 01/15/17   Authorization Type [employer] TRAD- KX at visit 15   PT Start Time 0848   PT Stop Time 0934   PT Time Calculation (min) 46 min   Activity Tolerance Patient tolerated treatment well   Behavior During Therapy [facility] for tasks assessed/performed      History reviewed. No pertinent past medical history.  Past Surgical History:  Procedure Laterality Date  . BREAST BIOPSY Right 08/17/1997  . BREAST EXCISIONAL BIOPSY Right 1990    There were no vitals filed for this visit.      Subjective Assessment - 12/23/16 0848    Subjective I don't remember what we talked about for the core, I did the leg lift to the side. Trainer has me doing squats with bar overhead and looking at the ceiling.    Currently in Pain? No/denies       Reformer: 2R1B core engagement with press aways [place] static bridges, ball bw knees 1R1B single leg press away, opp knee at table top 1R1B knee plank press 1R1B sidelying press               [facility] Adult PT Treatment/Exercise - 12/23/16 0001      Exercises   Exercises Other Exercises   Other Exercises  reformer- see PT note     Knee/Hip Exercises: Standing   Other Standing Knee Exercises squats    Other Standing Knee Exercises wall sit with press                PT Education - 12/23/16 0938    Education provided Yes   Education Details exercise form/rationale, transition of muscle contractions to her exercises at the gym   Person(s) Educated Patient   Methods Explanation;Demonstration;Tactile  cues;Verbal cues   Comprehension Verbalized understanding;Returned demonstration;Verbal cues required;Tactile cues required;Need further instruction          PT Short Term Goals - 12/15/16 1315      PT SHORT TERM GOAL #1   Title Pt will demo proper squat form utilizing abdominal wall for lumbar and pelvic support by 7/6   Baseline began educating on core engagement at eval   Time 4   Period Weeks   Status New     PT SHORT TERM GOAL #2   Title Pt will demo high level balance tasks with good balance and independent corrections in LOB   Baseline SBA by PT at eval, pt waits too long to catch herself   Time 4   Period Weeks   Status New     PT SHORT TERM GOAL #3   Title Pt will demo SLS activities with level pelvis and core engagement   Baseline began educating at eval   Time 4   Period Weeks   Status New     PT SHORT TERM GOAL #4   Title Pt will verbalize improved confidence in challenging strength and balance to continue working after d/c   Baseline fearful of falling at eval   Time 4   Period Weeks   Status  New                  Plan - 12/23/16 1224    Clinical Impression Statement Utilized reformer today to increase pt understanding of muscular activation. Significant difficulty in utilizing core and gluts at the same time. Discussed transitioning these muscular contractions into the exercises she is doing at the gym. Time taken to review squat form. Significant cavitation noted in knees that is painful with repeated squats, utilized wall sit for muscular activaiton without excessive stress in knees.    PT Treatment/Interventions ADLs/Self Care Home Management;Cryotherapy;Electrical Stimulation;Functional mobility training;Stair training;Gait training;Moist Heat;Therapeutic activities;Therapeutic exercise;Balance training;Neuromuscular re-education;Patient/family education;Passive range of motion;Manual techniques;Taping;Dry needling   PT Next Visit Plan review  planks, balance   PT Home Exercise Plan hip abduction, pelvic tilt-focus on muscular engagement   Consulted and Agree with Plan of Care Patient      Patient will benefit from skilled therapeutic intervention in order to improve the following deficits and impairments:  Decreased knowledge of precautions, Decreased strength  Visit Diagnosis: Muscle weakness (generalized)     Problem List There are no active problems to display for this patient.   C.  PT, DPT 12/23/16 12:29 PM   [facility] [address] [place], [place], [address] Phone: [phone]   Fax:  [phone]  Name: Renee Smith MRN: [id number] Date of Birth: 11-01-1945

## 2016-12-25 ENCOUNTER — Ambulatory Visit: Payer: Medicare Other | Admitting: Physical Therapy

## 2016-12-25 DIAGNOSIS — M6281 Muscle weakness (generalized): Secondary | ICD-10-CM

## 2016-12-25 NOTE — Therapy (Signed)
[place], [place], [address] Phone: [phone]   Fax:  [phone]  Physical Therapy Treatment  Patient Details  Name: Renee Smith MRN: [id number] Date of Birth: Dec 13, 1945 Referring Provider: Sabra Heck, MD  Encounter Date: 12/25/2016      PT End of Session - 12/25/16 1035    Visit Number 3   Number of Visits 9   Date for PT Re-Evaluation 01/15/17   Authorization Type [employer] TRAD- KX at visit 15   PT Start Time 1015   PT Stop Time 1100   PT Time Calculation (min) 45 min      No past medical history on file.  Past Surgical History:  Procedure Laterality Date  . BREAST BIOPSY Right 08/17/1997  . BREAST EXCISIONAL BIOPSY Right 1990    There were no vitals filed for this visit.                       [facility] Adult PT Treatment/Exercise - 12/25/16 0001      Self-Care   Self-Care Posture   Posture Discussed spine extension verses flexion with pt having no knowledge of benefits of extension biased posture and exercise with osteoporosis.      Knee/Hip Exercises: Aerobic   Elliptical Res 8, Incline 8 x 5 minutes      Knee/Hip Exercises: Standing   SLS cues for level pelvis & glut med activation, added 3 way hip on SLS bilateral    Other Standing Knee Exercises squats    Other Standing Knee Exercises wall sit- cues for core      Knee/Hip Exercises: Supine   Other Supine Knee/Hip Exercises Reformer - 2 red 1 blue foot work on heel and toes, Bridge [place]      Knee/Hip Exercises: Prone   Other Prone Exercises planks on forearms and toes                  PT Short Term Goals - 12/15/16 1315      PT SHORT TERM GOAL #1   Title Pt will demo proper squat form utilizing abdominal wall for lumbar and pelvic support by 7/6   Baseline began educating on core engagement at eval   Time 4   Period Weeks   Status New     PT SHORT TERM GOAL #2   Title Pt will  demo high level balance tasks with good balance and independent corrections in LOB   Baseline SBA by PT at eval, pt waits too long to catch herself   Time 4   Period Weeks   Status New     PT SHORT TERM GOAL #3   Title Pt will demo SLS activities with level pelvis and core engagement   Baseline began educating at eval   Time 4   Period Weeks   Status New     PT SHORT TERM GOAL #4   Title Pt will verbalize improved confidence in challenging strength and balance to continue working after d/c   Baseline fearful of falling at eval   Time 4   Period Weeks   Status New                  Plan - 12/25/16 1041    Clinical Impression Statement Pt with concerns about Right foot turning out on elliptical, hip ROM seems equal bilateral. She does shift weight during squats which may contribute to some hip weakness. Spinal  curvature noted. Discussed extension biased exercises and avoiding flexion of spine while in weight bearing. Pt will benefit from more education on Osteoporosis and exercise/ body mechanics.  Repeated reformer with max cues for neutral spine to avoid excessive  lumbar flexion.    PT Next Visit Plan review planks, balance   PT Home Exercise Plan hip abduction, pelvic tilt-focus on muscular engagement   Consulted and Agree with Plan of Care Patient      Patient will benefit from skilled therapeutic intervention in order to improve the following deficits and impairments:  Decreased knowledge of precautions, Decreased strength  Visit Diagnosis: Muscle weakness (generalized)     Problem List There are no active problems to display for this patient.   Dorene Ar, [place] 12/25/2016, 11:20 AM  [place], [place], [address] Phone: [phone]   Fax:  [phone]  Name: DRISANA SCHWEICKERT MRN: [id number] Date of Birth: 11/16/1945

## 2016-12-29 ENCOUNTER — Ambulatory Visit: Payer: Medicare Other | Admitting: Physical Therapy

## 2016-12-29 ENCOUNTER — Encounter: Payer: Self-pay | Admitting: Physical Therapy

## 2016-12-29 DIAGNOSIS — M6281 Muscle weakness (generalized): Secondary | ICD-10-CM | POA: Diagnosis not present

## 2016-12-29 NOTE — Therapy (Signed)
[place], [place], [address] Phone: [phone]   Fax:  [phone]  Physical Therapy Treatment  Patient Details  Name: Renee Smith MRN: [id number] Date of Birth: [birth year] Referring Provider: Sabra Heck, MD  Encounter Date: 12/29/2016      PT End of Session - 12/29/16 0850    Visit Number 4   Number of Visits 9   Date for PT Re-Evaluation 01/15/17   Authorization Type [employer] TR[employer]- KX at visit 15   PT Start Time 0850   PT Stop Time 0933   PT Time Calculation (min) 43 min   Activity Tolerance Patient tolerated treatment well   Behavior During Therapy [facility] for tasks assessed/performed      History reviewed. No pertinent past medical history.  Past Surgical History:  Procedure Laterality Date  . BREAST BIOPSY Right 08/17/1997  . BREAST EXCISIONAL BIOPSY Right 1990    There were no vitals filed for this visit.      Subjective Assessment - 12/29/16 0851    Subjective biggest take away is that I don't hold my stomach in and when I do, I don't breathe   Currently in Pain? No/denies                         [facility] Adult PT Treatment/Exercise - 12/29/16 0001      Knee/Hip Exercises: Stretches   Passive Hamstring Stretch Limitations supine with green strap   Hip Flexor Stretch Limitations thomas test stretch   Other Knee/Hip Stretches figure 4     Knee/Hip Exercises: Supine   Bridges Limitations cues for abdominal engagement   Single Leg Bridge Both;5 reps   Other Supine Knee/Hip Exercises hooklying pelvic tilt & marching     Knee/Hip Exercises: Prone   Other Prone Exercises qped bird dog                PT Education - 12/29/16 [phone]    Education provided Yes   Education Details exercise form/rationale, being aware of activation patterns   Person(s) Educated Patient   Methods Explanation;Demonstration;Tactile cues;Verbal cues;Handout   Comprehension  Verbalized understanding;Verbal cues required;Returned demonstration;Tactile cues required;Need further instruction          PT Short Term Goals - 12/15/16 1315      PT SHORT TERM GOAL #1   Title Pt will demo proper squat form utilizing abdominal wall for lumbar and pelvic support by 7/6   Baseline began educating on core engagement at eval   Time 4   Period Weeks   Status New     PT SHORT TERM GOAL #2   Title Pt will demo high level balance tasks with good balance and independent corrections in LOB   Baseline SBA by PT at eval, pt waits too long to catch herself   Time 4   Period Weeks   Status New     PT SHORT TERM GOAL #3   Title Pt will demo SLS activities with level pelvis and core engagement   Baseline began educating at eval   Time 4   Period Weeks   Status New     PT SHORT TERM GOAL #4   Title Pt will verbalize improved confidence in challenging strength and balance to continue working after d/c   Baseline fearful of falling at eval   Time 4   Period Weeks   Status New  Plan - 12/29/16 0934    Clinical Impression Statement Focus today was on coordinating breathing with abdominal contraction to increase work and challenge in the exercises she does. Pt had significant difficulty and we discussed performing only as many repetitions as she can in the correct manner. I asked her to make a list of some other exercises she does on a regular basis in order to retrain movements for best benefit and keeping osteoporosis in mind.    PT Treatment/Interventions ADLs/Self Care Home Management;Cryotherapy;Electrical Stimulation;Functional mobility training;Stair training;Gait training;Moist Heat;Therapeutic activities;Therapeutic exercise;Balance training;Neuromuscular re-education;Patient/family education;Passive range of motion;Manual techniques;Taping;Dry needling   PT Next Visit Plan standing balance, any exercises in particular she noticed at the gym    PT Home Exercise Plan hip abduction, pelvic tilt-focus on muscular engagement; bridge, single leg bridge, march with pelvic tilt, bird dog   Consulted and Agree with Plan of Care Patient      Patient will benefit from skilled therapeutic intervention in order to improve the following deficits and impairments:  Decreased knowledge of precautions, Decreased strength  Visit Diagnosis: Muscle weakness (generalized)     Problem List There are no active problems to display for this patient.   C.  PT, DPT 12/29/16 9:38 AM   [facility] [address] [place], [place], [address] Phone: [phone]   Fax:  [phone]  Name: Renee Smith MRN: [id number] Date of Birth: 1945/12/06

## 2017-01-04 ENCOUNTER — Ambulatory Visit: Payer: Medicare Other | Admitting: Physical Therapy

## 2017-01-04 DIAGNOSIS — M6281 Muscle weakness (generalized): Secondary | ICD-10-CM

## 2017-01-04 NOTE — Therapy (Signed)
[place], [place], [address] Phone: [phone]   Fax:  [phone]  Physical Therapy Treatment  Patient Details  Name: Renee Smith MRN: [id number] Date of Birth: May 18, 1946 Referring Provider: Sabra Heck, MD  Encounter Date: 01/04/2017      PT End of Session - 01/04/17 0939    Visit Number 5   Number of Visits 9   Date for PT Re-Evaluation 01/15/17   Authorization Type [employer] TR[employer]- KX at visit 15   PT Start Time 0847   PT Stop Time 0928   PT Time Calculation (min) 41 min      No past medical history on file.  Past Surgical History:  Procedure Laterality Date  . BREAST BIOPSY Right 08/17/1997  . BREAST EXCISIONAL BIOPSY Right 1990    There were no vitals filed for this visit.      Subjective Assessment - 01/04/17 0853    Subjective I am trying to tighten my core more often.    Currently in Pain? No/denies                         [facility] Adult PT Treatment/Exercise - 01/04/17 0001      Knee/Hip Exercises: Stretches   Hip Flexor Stretch Limitations thomas test stretch     Knee/Hip Exercises: Standing   SLS cues for level pelvis 60 sec each , on foam pad 30 sec best each side, used parallel bars and wall      Knee/Hip Exercises: Supine   Bridges Limitations cues for abdominal engagement, then advanced to bridge with knee extensions    Single Leg Bridge 10 reps   Other Supine Knee/Hip Exercises hooklying neutral Transverse abdominal engagement with bent knee raise , progressed to table top toe taps.      Knee/Hip Exercises: Prone   Other Prone Exercises qped bird dog                  PT Short Term Goals - 12/15/16 1315      PT SHORT TERM GOAL #1   Title Pt will demo proper squat form utilizing abdominal wall for lumbar and pelvic support by 7/6   Baseline began educating on core engagement at eval   Time 4   Period Weeks   Status New     PT  SHORT TERM GOAL #2   Title Pt will demo high level balance tasks with good balance and independent corrections in LOB   Baseline SBA by PT at eval, pt waits too long to catch herself   Time 4   Period Weeks   Status New     PT SHORT TERM GOAL #3   Title Pt will demo SLS activities with level pelvis and core engagement   Baseline began educating at eval   Time 4   Period Weeks   Status New     PT SHORT TERM GOAL #4   Title Pt will verbalize improved confidence in challenging strength and balance to continue working after d/c   Baseline fearful of falling at eval   Time 4   Period Weeks   Status New                  Plan - 01/04/17 0935    Clinical Impression Statement Review of new HEP with need to correct supine marching, pt was bridging during the march. Repeated supine marching and progressed to table top  holds with toe taps. Pt able to continue for 1 minutes. All other exercises pt, was independent. Performed SLS exercises with pt reaching 1 minute static and 30 sec on foam pad. USed foam pad at wall to simulate for gym and educated pt on safety when loosing balance. Pt able to demonstrate independent recovery during LOB using wall to assist as needed. Progressing toward STGs.    PT Next Visit Plan standing balance, any exercises in particular she noticed at the gym   PT Home Exercise Plan hip abduction, pelvic tilt-focus on muscular engagement; bridge, single leg bridge, march with pelvic tilt, bird dog   Consulted and Agree with Plan of Care Patient      Patient will benefit from skilled therapeutic intervention in order to improve the following deficits and impairments:  Decreased knowledge of precautions, Decreased strength  Visit Diagnosis: Muscle weakness (generalized)     Problem List There are no active problems to display for this patient.   Dorene Ar, [place] 01/04/2017, 9:40 AM  [place], [place], [address] Phone: [phone]   Fax:  [phone]  Name: SHANTEA POULTON MRN: [id number] Date of Birth: 07/01/1946

## 2017-01-07 ENCOUNTER — Encounter: Payer: Self-pay | Admitting: Physical Therapy

## 2017-01-07 ENCOUNTER — Ambulatory Visit: Payer: Medicare Other | Admitting: Physical Therapy

## 2017-01-07 DIAGNOSIS — M6281 Muscle weakness (generalized): Secondary | ICD-10-CM

## 2017-01-07 NOTE — Therapy (Signed)
[place], [place], [address] Phone: [phone]   Fax:  [phone]  Physical Therapy Treatment  Patient Details  Name: Renee Smith MRN: [id number] Date of Birth: December 31, 1945 Referring Provider: Sabra Heck, MD  Encounter Date: 01/07/2017      PT End of Session - 01/07/17 0846    Visit Number 6   Number of Visits 9   Date for PT Re-Evaluation 01/15/17   Authorization Type [employer] TRAD- KX at visit 15   PT Start Time 0846   PT Stop Time 0933   PT Time Calculation (min) 47 min   Activity Tolerance Patient tolerated treatment well   Behavior During Therapy Anxious      History reviewed. No pertinent past medical history.  Past Surgical History:  Procedure Laterality Date  . BREAST BIOPSY Right 08/17/1997  . BREAST EXCISIONAL BIOPSY Right 1990    There were no vitals filed for this visit.      Subjective Assessment - 01/07/17 0846    Subjective pt reports falling while walking. was holding the hands of two small children, unaware of what happened. Her grand daughter said she may have tripped her. Mild soreness in hips for about a day after but okay today. Discouraged by fall.    Currently in Pain? No/denies                         [facility] Adult PT Treatment/Exercise - 01/07/17 0001      Exercises   Other Exercises  reformer: 2R1B press, static bridge, single leg press-opp leg ext, heel raises- knees flexed & edt     Knee/Hip Exercises: Stretches   Active Hamstring Stretch 5 reps;Both   Other Knee/Hip Stretches figure 4   Other Knee/Hip Stretches LTR                PT Education - 01/07/17 0944    Education provided Yes   Education Details discussed fall scenario & stress, accident vs frequent falling, LLD(structural & functional) & lift-not left in shoe, exercise form/rationale, fractures & symptoms   Person(s) Educated Patient   Methods  Explanation;Demonstration;Tactile cues;Verbal cues   Comprehension Verbalized understanding;Returned demonstration;Verbal cues required;Tactile cues required;Need further instruction          PT Short Term Goals - 12/15/16 1315      PT SHORT TERM GOAL #1   Title Pt will demo proper squat form utilizing abdominal wall for lumbar and pelvic support by 7/6   Baseline began educating on core engagement at eval   Time 4   Period Weeks   Status New     PT SHORT TERM GOAL #2   Title Pt will demo high level balance tasks with good balance and independent corrections in LOB   Baseline SBA by PT at eval, pt waits too long to catch herself   Time 4   Period Weeks   Status New     PT SHORT TERM GOAL #3   Title Pt will demo SLS activities with level pelvis and core engagement   Baseline began educating at eval   Time 4   Period Weeks   Status New     PT SHORT TERM GOAL #4   Title Pt will verbalize improved confidence in challenging strength and balance to continue working after d/c   Baseline fearful of falling at eval   Time 4   Period Weeks   Status New  Plan - 01/07/17 0904    Clinical Impression Statement L leg length 81.5 cm, R 80.5. Tried lift in shoe but decided against leaving it in. Pt was very disappointed and discouraged by fall. I told the pt that she was under a lot of stress at the time (walking two young children in [place] riding subway) and accidents do happen, it does not necessarily mean she has had a set back and will fall all of the time. Discussed pain she would notice had she fractured her hip. Felt stress in her ankle with SLS band pulls but was able to do the reformer without pain. Will progress exercises next visit as appropraite. I asked pt try to decrease her stress and rest a little so we can continue next time.    PT Treatment/Interventions ADLs/Self Care Home Management;Cryotherapy;Electrical Stimulation;Functional mobility  training;Stair training;Gait training;Moist Heat;Therapeutic activities;Therapeutic exercise;Balance training;Neuromuscular re-education;Patient/family education;Passive range of motion;Manual techniques;Taping;Dry needling   PT Next Visit Plan dynamic balance exercises   PT Home Exercise Plan hip abduction, pelvic tilt-focus on muscular engagement; bridge, single leg bridge, march with pelvic tilt, bird dog   Consulted and Agree with Plan of Care Patient      Patient will benefit from skilled therapeutic intervention in order to improve the following deficits and impairments:  Decreased knowledge of precautions, Decreased strength  Visit Diagnosis: Muscle weakness (generalized)     Problem List There are no active problems to display for this patient.   C.  PT, DPT 01/07/17 9:45 AM   [facility] [address] [place], [place], [address] Phone: [phone]   Fax:  [phone]  Name: Renee Smith MRN: [id number] Date of Birth: 1945/08/26

## 2017-01-11 ENCOUNTER — Encounter: Payer: Self-pay | Admitting: Physical Therapy

## 2017-01-11 ENCOUNTER — Ambulatory Visit: Payer: Medicare Other | Attending: Internal Medicine | Admitting: Physical Therapy

## 2017-01-11 DIAGNOSIS — M6281 Muscle weakness (generalized): Secondary | ICD-10-CM | POA: Insufficient documentation

## 2017-01-11 NOTE — Therapy (Signed)
[place], [place], [address] Phone: [phone]   Fax:  [phone]  Physical Therapy Treatment  Patient Details  Name: Renee Smith MRN: [id number] Date of Birth: [birth year] Referring Provider: Sabra Heck, MD  Encounter Date: 01/11/2017      PT End of Session - 01/11/17 0847    Visit Number 7   Number of Visits 9   Date for PT Re-Evaluation 01/15/17   Authorization Type [employer] TRAD- KX at visit 15   PT Start Time 0847   PT Stop Time 0929   PT Time Calculation (min) 42 min   Activity Tolerance Patient tolerated treatment well   Behavior During Therapy [facility] for tasks assessed/performed      History reviewed. No pertinent past medical history.  Past Surgical History:  Procedure Laterality Date  . BREAST BIOPSY Right 08/17/1997  . BREAST EXCISIONAL BIOPSY Right 1990    There were no vitals filed for this visit.      Subjective Assessment - 01/11/17 0847    Subjective Denies any pain today   Currently in Pain? No/denies                         [facility] Adult PT Treatment/Exercise - 01/11/17 0001      Knee/Hip Exercises: Standing   Rocker Board Limitations A/P & lat, static & dynamic   SLS static holds.      Knee/Hip Exercises: Seated   Other Seated Knee/Hip Exercises on bosu-both sides- core &  balance variations                PT Education - 01/11/17 0859    Education provided Yes   Education Details exercise form/rationale, balance challenges at gym-pairing with head turns, upper ext movements etc.   Person(s) Educated Patient   Methods Explanation;Demonstration;Tactile cues;Verbal cues   Comprehension Verbalized understanding;Returned demonstration;Verbal cues required;Tactile cues required;Need further instruction          PT Short Term Goals - 12/15/16 1315      PT SHORT TERM GOAL #1   Title Pt will demo proper squat form utilizing abdominal  wall for lumbar and pelvic support by 7/6   Baseline began educating on core engagement at eval   Time 4   Period Weeks   Status New     PT SHORT TERM GOAL #2   Title Pt will demo high level balance tasks with good balance and independent corrections in LOB   Baseline SBA by PT at eval, pt waits too long to catch herself   Time 4   Period Weeks   Status New     PT SHORT TERM GOAL #3   Title Pt will demo SLS activities with level pelvis and core engagement   Baseline began educating at eval   Time 4   Period Weeks   Status New     PT SHORT TERM GOAL #4   Title Pt will verbalize improved confidence in challenging strength and balance to continue working after d/c   Baseline fearful of falling at eval   Time 4   Period Weeks   Status New                  Plan - 01/11/17 1058    Clinical Impression Statement Challenged balance ability in standing and on unstable surfaces. Worked with bosu for transfer to gym as well as for use in seated. Pt had  significant difficulty in dynamic balance exercises and will continue to benefit from challenges to improve dynamic balance and safety.    PT Treatment/Interventions ADLs/Self Care Home Management;Cryotherapy;Electrical Stimulation;Functional mobility training;Stair training;Gait training;Moist Heat;Therapeutic activities;Therapeutic exercise;Balance training;Neuromuscular re-education;Patient/family education;Passive range of motion;Manual techniques;Taping;Dry needling   PT Next Visit Plan check goals-will recert for 3 weeks   PT Home Exercise Plan hip abduction, pelvic tilt-focus on muscular engagement; bridge, single leg bridge, march with pelvic tilt, bird dog   Consulted and Agree with Plan of Care Patient      Patient will benefit from skilled therapeutic intervention in order to improve the following deficits and impairments:  Decreased knowledge of precautions, Decreased strength  Visit Diagnosis: Muscle weakness  (generalized)     Problem List There are no active problems to display for this patient.    C.  PT, DPT 01/11/17 1:12 PM   [facility] [address] [place], [place], [address] Phone: [phone]   Fax:  [phone]  Name: Renee Smith MRN: [id number] Date of Birth: [birth year]

## 2017-01-14 ENCOUNTER — Encounter: Payer: Medicare Other | Admitting: Physical Therapy

## 2017-01-15 ENCOUNTER — Ambulatory Visit: Payer: Medicare Other | Admitting: Physical Therapy

## 2017-01-15 ENCOUNTER — Encounter: Payer: Self-pay | Admitting: Physical Therapy

## 2017-01-15 DIAGNOSIS — M6281 Muscle weakness (generalized): Secondary | ICD-10-CM

## 2017-01-15 NOTE — Therapy (Signed)
[place], [place], [address] Phone: [phone]   Fax:  [phone]  Physical Therapy Treatment  Patient Details  Name: Renee Smith MRN: [id number] Date of Birth: Jan 04, 1946 Referring Provider: Sabra Heck, MD  Encounter Date: 01/15/2017      PT End of Session - 01/15/17 0846    Visit Number 8   Number of Visits 14   Date for PT Re-Evaluation 02/05/17   Authorization Type [employer] TR[employer]- KX at visit 15   PT Start Time 0846   PT Stop Time 0930   PT Time Calculation (min) 44 min   Equipment Utilized During Treatment Gait belt   Activity Tolerance Patient tolerated treatment well   Behavior During Therapy [facility] for tasks assessed/performed      History reviewed. No pertinent past medical history.  Past Surgical History:  Procedure Laterality Date  . BREAST BIOPSY Right 08/17/1997  . BREAST EXCISIONAL BIOPSY Right 1990    There were no vitals filed for this visit.          [facility] PT Assessment - 01/15/17 0001      Assessment   Medical Diagnosis osteoporosis   Referring Provider Sabra Heck, MD     Strength   Overall Strength Comments R hip abduction 4/5, L hip ext 4/5                     [facility] Adult PT Treatment/Exercise - 01/15/17 0001      Knee/Hip Exercises: Standing   Other Standing Knee Exercises high kneeling balance and core variations on bosu   Other Standing Knee Exercises lunges- one foot bosu.      Knee/Hip Exercises: Seated   Other Seated Knee/Hip Exercises seated core balance on bosu                PT Education - 01/15/17 1154    Education provided Yes   Education Details exercise form/rationale, balance assessment, age appropraite benchmarks, progress toward goals   Person(s) Educated Patient   Methods Explanation;Demonstration;Tactile cues;Verbal cues   Comprehension Verbalized understanding;Returned demonstration;Verbal cues  required;Tactile cues required;Need further instruction          PT Short Term Goals - 01/15/17 0848      PT SHORT TERM GOAL #1   Title Pt will demo proper squat form utilizing abdominal wall for lumbar and pelvic support by 7/6   Baseline improved form to move at midline but requires cuing for abdominal engagment   Status On-going     PT SHORT TERM GOAL #2   Title Pt will demo high level balance tasks with good balance and independent corrections in LOB   Baseline good static on L, moderate static on R leg; slows in gait with head turns-feels nervous; poor dynamic stability bilat on unsteady surfaces (i.e. grass)   Status On-going     PT SHORT TERM GOAL #3   Title Pt will demo SLS activities with level pelvis and core engagement   Baseline pelvic drop while standing on R leg.    Status On-going     PT SHORT TERM GOAL #4   Title Pt will verbalize improved confidence in challenging strength and balance to continue working after d/c   Baseline 25% increase in confidence but still nervous   Status On-going           PT Long Term Goals - 01/15/17 1150      PT LONG TERM GOAL #  1   Title Pt will demo good quality control of dynamic balance and balance on unsteady surfaces to improve safety with activities by 7/27   Baseline poor control in dynamic and unsteady surfaces   Time 3   Period Weeks   Status New               Plan - 01/31/17 0930    Clinical Impression Statement Pt with notable lack of stability and hip drop in R SLS, statically as well as poor balance in bilat LE during dynamic movements. Slows gait with head turns and feels nervous when doing so. Pt is active and will benefit from challenges with her balance in high level activities to decrease risk of fall and fracutre.    PT Frequency 2x / week   PT Duration 3 weeks   PT Treatment/Interventions ADLs/Self Care Home Management;Cryotherapy;Electrical Stimulation;Functional mobility training;Stair  training;Gait training;Moist Heat;Therapeutic activities;Therapeutic exercise;Balance training;Neuromuscular re-education;Patient/family education;Passive range of motion;Manual techniques;Taping;Dry needling   PT Next Visit Plan level pelvis in R SLS, dynamic balance/unsteady surfaces   PT Home Exercise Plan hip abduction, pelvic tilt-focus on muscular engagement; bridge, single leg bridge, march with pelvic tilt, bird dog   Consulted and Agree with Plan of Care Patient      Patient will benefit from skilled therapeutic intervention in order to improve the following deficits and impairments:  Decreased knowledge of precautions, Decreased strength  Visit Diagnosis: Muscle weakness (generalized) - Plan: PT plan of care cert/re-cert       G-Codes - January 31, 2017 1151    Functional Assessment Tool Used (Outpatient Only) clinical judgement, gross strength   Functional Limitation Mobility: Walking and moving around   Mobility: Walking and Moving Around Current Status (I3568) At least 20 percent but less than 40 percent impaired, limited or restricted   Mobility: Walking and Moving Around Goal Status (S1683) At least 1 percent but less than 20 percent impaired, limited or restricted      Problem List There are no active problems to display for this patient.    C.  PT, DPT 01-31-2017 11:54 AM   [place] [facility] [address] [place], [place], [address] Phone: [phone]   Fax:  [phone]  Name: WINONA SISON MRN: [id number] Date of Birth: March 22, 1946

## 2017-01-19 ENCOUNTER — Ambulatory Visit: Payer: Medicare Other | Admitting: Physical Therapy

## 2017-01-19 DIAGNOSIS — M6281 Muscle weakness (generalized): Secondary | ICD-10-CM | POA: Diagnosis not present

## 2017-01-19 NOTE — Therapy (Signed)
[place], [place], [address] Phone: [phone]   Fax:  [phone]  Physical Therapy Treatment  Patient Details  Name: Renee Smith MRN: [id number] Date of Birth: [birth year] Referring Provider: Sabra Heck, MD  Encounter Date: 01/19/2017      PT End of Session - 01/19/17 1018    Visit Number 9   Number of Visits 14   Date for PT Re-Evaluation 02/05/17   Authorization Type [employer] TRAD- KX at visit 15   PT Start Time 1016   PT Stop Time 1057   PT Time Calculation (min) 41 min      No past medical history on file.  Past Surgical History:  Procedure Laterality Date  . BREAST BIOPSY Right 08/17/1997  . BREAST EXCISIONAL BIOPSY Right 1990    There were no vitals filed for this visit.                       [facility] Adult PT Treatment/Exercise - 01/19/17 0001      Knee/Hip Exercises: Aerobic   Stationary Bike Rec Bike L3 x 5 minnutes      Knee/Hip Exercises: Standing   Rebounder on and off foam pad    Other Standing Knee Exercises lunges- one foot bosu. and push off in parallel bars              Balance Exercises - 01/19/17 1448      Balance Exercises: Standing   Standing Eyes Opened Narrow base of support (BOS);Head turns;Foam/compliant surface   Standing Eyes Closed Head turns;Foam/compliant surface;Narrow base of support (BOS)   Tandem Stance Eyes open;Eyes closed;Foam/compliant surface;Intermittent upper extremity support   Rebounder Foam/compliant surface;Single leg;10 reps   Gait with Head Turns Forward;5 reps   Other Standing Exercises Split kneeling with trunk rotations knee on foam, foot on balance disc             PT Short Term Goals - 01/15/17 0848      PT SHORT TERM GOAL #1   Title Pt will demo proper squat form utilizing abdominal wall for lumbar and pelvic support by 7/6   Baseline improved form to move at midline but requires cuing for  abdominal engagment   Status On-going     PT SHORT TERM GOAL #2   Title Pt will demo high level balance tasks with good balance and independent corrections in LOB   Baseline good static on L, moderate static on R leg; slows in gait with head turns-feels nervous; poor dynamic stability bilat on unsteady surfaces (i.e. grass)   Status On-going     PT SHORT TERM GOAL #3   Title Pt will demo SLS activities with level pelvis and core engagement   Baseline pelvic drop while standing on R leg.    Status On-going     PT SHORT TERM GOAL #4   Title Pt will verbalize improved confidence in challenging strength and balance to continue working after d/c   Baseline 25% increase in confidence but still nervous   Status On-going           PT Long Term Goals - 01/15/17 1150      PT LONG TERM GOAL #1   Title Pt will demo good quality control of dynamic balance and balance on unsteady surfaces to improve safety with activities by 7/27   Baseline poor control in dynamic and unsteady surfaces   Time 3   Period Weeks  Status New               Plan - 01/19/17 1130    Clinical Impression Statement Higher level balance activities used to challenge static and dynamic balance. Fatigue, no pain. Discussed avoiding flexion and rotation exercises during gym workouts.    PT Next Visit Plan level pelvis in R SLS, dynamic balance/unsteady surfaces   PT Home Exercise Plan hip abduction, pelvic tilt-focus on muscular engagement; bridge, single leg bridge, march with pelvic tilt, bird dog   Consulted and Agree with Plan of Care Patient      Patient will benefit from skilled therapeutic intervention in order to improve the following deficits and impairments:  Decreased knowledge of precautions, Decreased strength  Visit Diagnosis: Muscle weakness (generalized)     Problem List There are no active problems to display for this patient.   Dorene Ar, [place] 01/19/2017, 2:48 PM  [place], [place], [address] Phone: [phone]   Fax:  [phone]  Name: JANAA ACERO MRN: [id number] Date of Birth: July 15, 1945

## 2017-01-21 ENCOUNTER — Encounter: Payer: Medicare Other | Admitting: Physical Therapy

## 2017-01-22 ENCOUNTER — Ambulatory Visit: Payer: Medicare Other | Admitting: Physical Therapy

## 2017-01-22 DIAGNOSIS — M6281 Muscle weakness (generalized): Secondary | ICD-10-CM | POA: Diagnosis not present

## 2017-01-22 NOTE — Therapy (Signed)
[place], [place], [address] Phone: [phone]   Fax:  [phone]  Physical Therapy Treatment  Patient Details  Name: Renee Smith MRN: [id number] Date of Birth: [birth year] Referring Provider: Sabra Heck, MD  Encounter Date: 01/22/2017      PT End of Session - 01/22/17 0849    Visit Number 10   Number of Visits 14   Date for PT Re-Evaluation 02/05/17   Authorization Type [employer] TR[employer]- KX at visit 15   PT Start Time 0849  pt arrived late   PT Stop Time 0931   PT Time Calculation (min) 42 min   Activity Tolerance Patient tolerated treatment well   Behavior During Therapy [facility] for tasks assessed/performed      No past medical history on file.  Past Surgical History:  Procedure Laterality Date  . BREAST BIOPSY Right 08/17/1997  . BREAST EXCISIONAL BIOPSY Right 1990    There were no vitals filed for this visit.      Subjective Assessment - 01/22/17 0849    Subjective no pain, is trying some of the new balance challenges at the gym.    Currently in Pain? No/denies       Reformer: hooklying UE bar press with LE march hookying UE bar press with bilat SLR sidelying single arm spring press + hip abd/knee ext kick (also in side plank) Fire hydrant yellow resist on foot Bird dog yellow resist on foot Standing & seated chops yellow spring resist (standing painful on knees)                  [facility] Adult PT Treatment/Exercise - 01/22/17 0001      Exercises   Other Exercises  reformer- see PT note                PT Education - 01/22/17 0935    Education provided Yes   Education Details exercise form/rationale, avoiding flexion through spine, being aware of muscle groups working   [employer]) Educated Patient   Methods Explanation;Demonstration;Tactile cues;Verbal cues   Comprehension Verbalized understanding;Returned demonstration;Verbal cues required;Tactile cues  required;Need further instruction          PT Short Term Goals - 01/15/17 0848      PT SHORT TERM GOAL #1   Title Pt will demo proper squat form utilizing abdominal wall for lumbar and pelvic support by 7/6   Baseline improved form to move at midline but requires cuing for abdominal engagment   Status On-going     PT SHORT TERM GOAL #2   Title Pt will demo high level balance tasks with good balance and independent corrections in LOB   Baseline good static on L, moderate static on R leg; slows in gait with head turns-feels nervous; poor dynamic stability bilat on unsteady surfaces (i.e. grass)   Status On-going     PT SHORT TERM GOAL #3   Title Pt will demo SLS activities with level pelvis and core engagement   Baseline pelvic drop while standing on R leg.    Status On-going     PT SHORT TERM GOAL #4   Title Pt will verbalize improved confidence in challenging strength and balance to continue working after d/c   Baseline 25% increase in confidence but still nervous   Status On-going           PT Long Term Goals - 01/15/17 1150      PT LONG TERM GOAL #1  Title Pt will demo good quality control of dynamic balance and balance on unsteady surfaces to improve safety with activities by 7/27   Baseline poor control in dynamic and unsteady surfaces   Time 3   Period Weeks   Status New               Plan - 01/22/17 0935    Clinical Impression Statement Utilized reformer today for challenges to strength and balance. Focus on pt being mindful of muscle groups at work and discussing transion of these particular ideas to the gym. Knees tolerated quadruped well for a while but had too much pain for standing chops.    PT Treatment/Interventions ADLs/Self Care Home Management;Cryotherapy;Electrical Stimulation;Functional mobility training;Stair training;Gait training;Moist Heat;Therapeutic activities;Therapeutic exercise;Balance training;Neuromuscular re-education;Patient/family  education;Passive range of motion;Manual techniques;Taping;Dry needling   PT Next Visit Plan strength/balance, quiz her on appropraite form and muscle work   Oncologist with Plan of Care Patient      Patient will benefit from skilled therapeutic intervention in order to improve the following deficits and impairments:  Decreased knowledge of precautions, Decreased strength  Visit Diagnosis: Muscle weakness (generalized)     Problem List There are no active problems to display for this patient.   C.  PT, DPT 01/22/17 11:06 AM   [place] [facility] [address] [place], [place], [address] Phone: [phone]   Fax:  [phone]  Name: Renee Smith MRN: [id number] Date of Birth: [birth year]

## 2017-01-25 ENCOUNTER — Ambulatory Visit: Payer: Medicare Other | Admitting: Physical Therapy

## 2017-01-25 ENCOUNTER — Encounter: Payer: Self-pay | Admitting: Physical Therapy

## 2017-01-25 DIAGNOSIS — M6281 Muscle weakness (generalized): Secondary | ICD-10-CM

## 2017-01-25 NOTE — Therapy (Signed)
[place], [place], [address] Phone: [phone]   Fax:  [phone]  Physical Therapy Treatment  Patient Details  Name: Renee Smith MRN: [id number] Date of Birth: 1945-12-01 Referring Provider: Sabra Heck, MD  Encounter Date: 01/25/2017      PT End of Session - 01/25/17 0850    Visit Number 11   Number of Visits 14   Date for PT Re-Evaluation 02/05/17   Authorization Type [employer] TRAD- KX at visit 15   PT Start Time 0850   PT Stop Time 0929   PT Time Calculation (min) 39 min   Activity Tolerance Patient tolerated treatment well   Behavior During Therapy [facility] for tasks assessed/performed      History reviewed. No pertinent past medical history.  Past Surgical History:  Procedure Laterality Date  . BREAST BIOPSY Right 08/17/1997  . BREAST EXCISIONAL BIOPSY Right 1990    There were no vitals filed for this visit.      Subjective Assessment - 01/25/17 0851    Subjective has not tried chops at gym yet. Is doing planks at the gym and reports feeling harder with core contraction.    Currently in Pain? No/denies                         [facility] Adult PT Treatment/Exercise - 01/25/17 0001      Knee/Hip Exercises: Stretches   Passive Hamstring Stretch 2 reps;30 seconds;Both   Gastroc Stretch 2 reps;30 seconds;Both   Other Knee/Hip Stretches figure 4     Knee/Hip Exercises: Seated   Other Seated Knee/Hip Exercises seated bosu core/balance challenges   Marching Limitations russian twist & C-sit form     Knee/Hip Exercises: Supine   Bridges Limitations in DF, heels on bosu     Knee/Hip Exercises: Prone   Other Prone Exercises plank variations on mat and bosu                  PT Short Term Goals - 01/15/17 0848      PT SHORT TERM GOAL #1   Title Pt will demo proper squat form utilizing abdominal wall for lumbar and pelvic support by 7/6   Baseline improved  form to move at midline but requires cuing for abdominal engagment   Status On-going     PT SHORT TERM GOAL #2   Title Pt will demo high level balance tasks with good balance and independent corrections in LOB   Baseline good static on L, moderate static on R leg; slows in gait with head turns-feels nervous; poor dynamic stability bilat on unsteady surfaces (i.e. grass)   Status On-going     PT SHORT TERM GOAL #3   Title Pt will demo SLS activities with level pelvis and core engagement   Baseline pelvic drop while standing on R leg.    Status On-going     PT SHORT TERM GOAL #4   Title Pt will verbalize improved confidence in challenging strength and balance to continue working after d/c   Baseline 25% increase in confidence but still nervous   Status On-going           PT Long Term Goals - 01/15/17 1150      PT LONG TERM GOAL #1   Title Pt will demo good quality control of dynamic balance and balance on unsteady surfaces to improve safety with activities by 7/27   Baseline poor control in  dynamic and unsteady surfaces   Time 3   Period Weeks   Status New               Plan - 01/25/17 3953    Clinical Impression Statement use of bosu and challenged mat exercises. Focused on reducing twising and finding challenge in exercises without excessive strain on spine and hips.    PT Treatment/Interventions ADLs/Self Care Home Management;Cryotherapy;Electrical Stimulation;Functional mobility training;Stair training;Gait training;Moist Heat;Therapeutic activities;Therapeutic exercise;Balance training;Neuromuscular re-education;Patient/family education;Passive range of motion;Manual techniques;Taping;Dry needling   PT Next Visit Plan standing, strength/balance, quiz her on appropraite form and muscle work   PT Home Exercise Plan hip abduction, pelvic tilt-focus on muscular engagement; bridge, single leg bridge, march with pelvic tilt, bird dog   Consulted and Agree with Plan of Care  Patient      Patient will benefit from skilled therapeutic intervention in order to improve the following deficits and impairments:  Decreased knowledge of precautions, Decreased strength  Visit Diagnosis: Muscle weakness (generalized)     Problem List There are no active problems to display for this patient.    C.  PT, DPT 01/25/17 9:40 AM   [facility] [address] [place], [place], [address] Phone: [phone]   Fax:  [phone]  Name: Renee Smith MRN: [id number] Date of Birth: [birth year]

## 2017-01-27 ENCOUNTER — Encounter: Payer: Self-pay | Admitting: Physical Therapy

## 2017-01-27 ENCOUNTER — Ambulatory Visit: Payer: Medicare Other | Admitting: Physical Therapy

## 2017-01-27 DIAGNOSIS — M6281 Muscle weakness (generalized): Secondary | ICD-10-CM

## 2017-01-27 NOTE — Therapy (Signed)
[place], [place], [address] Phone: [phone]   Fax:  [phone]  Physical Therapy Treatment  Patient Details  Name: Renee Smith MRN: [id number] Date of Birth: Apr 25, 1946 Referring Provider: Sabra Heck, MD  Encounter Date: 01/27/2017      PT End of Session - 01/27/17 0854    Visit Number 12   Number of Visits 14   Date for PT Re-Evaluation 02/05/17   Authorization Type [employer] TRAD- KX at visit 15   PT Start Time 0850   PT Stop Time 0928   PT Time Calculation (min) 38 min   Activity Tolerance Patient tolerated treatment well   Behavior During Therapy [facility] for tasks assessed/performed      History reviewed. No pertinent past medical history.  Past Surgical History:  Procedure Laterality Date  . BREAST BIOPSY Right 08/17/1997  . BREAST EXCISIONAL BIOPSY Right 1990    There were no vitals filed for this visit.      Subjective Assessment - 01/27/17 0854    Subjective did slow bird dogs at gym, was able to feel core. I am trying to make the bosu my friend.    Currently in Pain? No/denies                         [facility] Adult PT Treatment/Exercise - 01/27/17 0001      Knee/Hip Exercises: Machines for Strengthening   Other Machine curl up from floor, lat pull, row      Knee/Hip Exercises: Seated   Other Seated Knee/Hip Exercises pelvic disociation on physioball; physioball roll outs; russian twists   Marching Limitations seated on physioball     Knee/Hip Exercises: Supine   Bridges Limitations legs on physioball   Other Supine Knee/Hip Exercises bridge over physioball with EL roll ins   Other Supine Knee/Hip Exercises V ups physioball bw legs                  PT Short Term Goals - 01/15/17 0848      PT SHORT TERM GOAL #1   Title Pt will demo proper squat form utilizing abdominal wall for lumbar and pelvic support by 7/6   Baseline improved form to  move at midline but requires cuing for abdominal engagment   Status On-going     PT SHORT TERM GOAL #2   Title Pt will demo high level balance tasks with good balance and independent corrections in LOB   Baseline good static on L, moderate static on R leg; slows in gait with head turns-feels nervous; poor dynamic stability bilat on unsteady surfaces (i.e. grass)   Status On-going     PT SHORT TERM GOAL #3   Title Pt will demo SLS activities with level pelvis and core engagement   Baseline pelvic drop while standing on R leg.    Status On-going     PT SHORT TERM GOAL #4   Title Pt will verbalize improved confidence in challenging strength and balance to continue working after d/c   Baseline 25% increase in confidence but still nervous   Status On-going           PT Long Term Goals - 01/15/17 1150      PT LONG TERM GOAL #1   Title Pt will demo good quality control of dynamic balance and balance on unsteady surfaces to improve safety with activities by 7/27   Baseline poor control in dynamic  and unsteady surfaces   Time 3   Period Weeks   Status New               Plan - 01/27/17 0923    Clinical Impression Statement focus today on pelvic dissociation for core engagement in gym equipment for UE and rowing to reduce poor alignment of lumbar spine.    PT Treatment/Interventions ADLs/Self Care Home Management;Cryotherapy;Electrical Stimulation;Functional mobility training;Stair training;Gait training;Moist Heat;Therapeutic activities;Therapeutic exercise;Balance training;Neuromuscular re-education;Patient/family education;Passive range of motion;Manual techniques;Taping;Dry needling   PT Next Visit Plan standing, strength/balance, quiz her on appropraite form and muscle work   PT Home Exercise Plan hip abduction, pelvic tilt-focus on muscular engagement; bridge, single leg bridge, march with pelvic tilt, bird dog   Consulted and Agree with Plan of Care Patient      Patient  will benefit from skilled therapeutic intervention in order to improve the following deficits and impairments:  Decreased knowledge of precautions, Decreased strength  Visit Diagnosis: Muscle weakness (generalized)     Problem List There are no active problems to display for this patient.    C.  PT, DPT 01/27/17 9:30 AM   [place], [place], [address] Phone: [phone]   Fax:  [phone]  Name: Renee Smith MRN: [id number] Date of Birth: Jul 23, 1945

## 2017-02-03 ENCOUNTER — Ambulatory Visit: Payer: Medicare Other | Admitting: Physical Therapy

## 2017-02-03 DIAGNOSIS — M6281 Muscle weakness (generalized): Secondary | ICD-10-CM | POA: Diagnosis not present

## 2017-02-03 NOTE — Therapy (Signed)
[place], [place], [address] Phone: [phone]   Fax:  [phone]  Physical Therapy Treatment  Patient Details  Name: Renee Smith MRN: [id number] Date of Birth: 29-Jan-1946 Referring Provider: Sabra Heck, MD  Encounter Date: 02/03/2017      PT End of Session - 02/03/17 0852    Visit Number 13   Number of Visits 14   Date for PT Re-Evaluation 02/05/17   Authorization Type [employer] TRAD- KX at visit 15   PT Start Time 0848   PT Stop Time 0930   PT Time Calculation (min) 42 min      No past medical history on file.  Past Surgical History:  Procedure Laterality Date  . BREAST BIOPSY Right 08/17/1997  . BREAST EXCISIONAL BIOPSY Right 1990    There were no vitals filed for this visit.      Subjective Assessment - 02/03/17 0852    Subjective I am sad to stop physical therapy   Currently in Pain? No/denies                         [facility] Adult PT Treatment/Exercise - 02/03/17 0001      Knee/Hip Exercises: Stretches   Gastroc Stretch 2 reps;30 seconds;Both     Knee/Hip Exercises: Aerobic   Stationary Bike Rec Bike L3 x 5 minnutes      Knee/Hip Exercises: Standing   Functional Squat 2 sets;10 seconds   Rebounder on and off foam pad              Balance Exercises - 02/03/17 1356      Balance Exercises: Standing   Standing Eyes Opened Narrow base of support (BOS);Head turns;Foam/compliant surface  tandem   Standing Eyes Closed Head turns;Foam/compliant surface  tandem   Tandem Stance Eyes open;Eyes closed;Foam/compliant surface;Intermittent upper extremity support  tandem    Rebounder Foam/compliant surface;Single leg;10 reps   Gait with Head Turns Forward;5 reps   Tandem Gait Forward;Retro;Foam/compliant surface;5 reps   Retro Gait 5 reps;Foam/compliant surface   Sidestepping Foam/compliant support;5 reps   Turning Right;Left;5 reps  compliant surface    Heel Raises Limitations 25 single each   Toe Raise Limitations 20             PT Short Term Goals - 02/03/17 0901      PT SHORT TERM GOAL #1   Title Pt will demo proper squat form utilizing abdominal wall for lumbar and pelvic support by 7/6   Baseline improved form to move at midline but requires cuing for abdominal engagment   Time 4   Period Weeks   Status On-going     PT SHORT TERM GOAL #2   Title Pt will demo high level balance tasks with good balance and independent corrections in LOB   Time 4   Period Weeks   Status On-going     PT SHORT TERM GOAL #3   Title Pt will demo SLS activities with level pelvis and core engagement   Time 4   Period Weeks   Status Achieved           PT Long Term Goals - 01/15/17 1150      PT LONG TERM GOAL #1   Title Pt will demo good quality control of dynamic balance and balance on unsteady surfaces to improve safety with activities by 7/27   Baseline poor control in dynamic and unsteady surfaces   Time  3   Period Weeks   Status New               Plan - 02/03/17 1003    Clinical Impression Statement Worked toward STG and LTGs by challenging dynamic balance, gait and SLS activites. Pt requiring less cues for muscles engagement. STG# 3 met.    PT Next Visit Plan check goals, review, discharge   PT Home Exercise Plan hip abduction, pelvic tilt-focus on muscular engagement; bridge, single leg bridge, march with pelvic tilt, bird dog   Consulted and Agree with Plan of Care Patient      Patient will benefit from skilled therapeutic intervention in order to improve the following deficits and impairments:  Decreased knowledge of precautions, Decreased strength  Visit Diagnosis: Muscle weakness (generalized)     Problem List There are no active problems to display for this patient.   Dorene Ar, [place] 02/03/2017, 1:59 PM  [place], [place], [address] Phone: [phone]   Fax:  [phone]  Name: Renee Smith MRN: [id number] Date of Birth: 12-Sep-1945

## 2017-02-05 ENCOUNTER — Ambulatory Visit: Payer: Medicare Other | Admitting: Physical Therapy

## 2017-02-05 ENCOUNTER — Encounter: Payer: Self-pay | Admitting: Physical Therapy

## 2017-02-05 DIAGNOSIS — M6281 Muscle weakness (generalized): Secondary | ICD-10-CM | POA: Diagnosis not present

## 2017-02-05 NOTE — Therapy (Signed)
[place], [place], [address] Phone: [phone]   Fax:  [phone]  Physical Therapy Treatment/Discharge Summary  Patient Details  Name: Renee Smith MRN: [id number] Date of Birth: 09/26/1945 Referring Provider: Sabra Heck, MD  Encounter Date: 02/05/2017      PT End of Session - 02/05/17 0845    Visit Number 14   Number of Visits 14   Date for PT Re-Evaluation 02/05/17   Authorization Type [employer] TRAD- KX at visit 15   PT Start Time 0845   PT Stop Time 0930   PT Time Calculation (min) 45 min   Activity Tolerance Patient tolerated treatment well   Behavior During Therapy [facility] for tasks assessed/performed      History reviewed. No pertinent past medical history.  Past Surgical History:  Procedure Laterality Date  . BREAST BIOPSY Right 08/17/1997  . BREAST EXCISIONAL BIOPSY Right 1990    There were no vitals filed for this visit.      Subjective Assessment - 02/05/17 1015    Subjective Feels prepared for independent program but is sad to be finished with PT. Improved confidence in balance but believes she will always have a fear of falling.    Patient Stated Goals reduce risk of fall   Currently in Pain? No/denies            [facility] PT Assessment - 02/05/17 0001      Assessment   Medical Diagnosis osteoporosis   Referring Provider Sabra Heck, MD     Strength   Overall Strength Comments R hip abd 4+/5, L hip ext 5/5                             PT Education - 02/05/17 1016    Education provided Yes   Education Details general safety, being mindful when exercising, thinking through exercises before beginning.    Person(s) Educated Patient   Methods Explanation   Comprehension Verbalized understanding          PT Short Term Goals - 02/05/17 0847      PT SHORT TERM GOAL #1   Title Pt will demo proper squat form utilizing abdominal wall for  lumbar and pelvic support by 7/6   Baseline able   Status Achieved     PT SHORT TERM GOAL #2   Title Pt will demo high level balance tasks with good balance and independent corrections in LOB   Baseline good dynamic stability, safe control bilat, good speed maintained in gait with head turns/start/stop/pivot   Status Achieved     PT SHORT TERM GOAL #3   Title Pt will demo SLS activities with level pelvis and core engagement   Baseline good core engagement   Status Achieved     PT SHORT TERM GOAL #4   Title Pt will verbalize improved confidence in challenging strength and balance to continue working after d/c   Baseline not brimming with confidence but is better   Status Achieved           PT Long Term Goals - 02/05/17 0853      PT LONG TERM GOAL #1   Title Pt will demo good quality control of dynamic balance and balance on unsteady surfaces to improve safety with activities by 7/27   Baseline good control, SBA for safety   Status Achieved  Plan - 2017-02-19 1016    Clinical Impression Statement Pt has met all goals at this time and is being d/c to independent program. Significant improvement noted in quality and control of movement and balance ability. Discussed how appropraite demand on musculoskeletal system can increase bone density. Pt was instructed to contact us with any further questions.    PT Treatment/Interventions ADLs/Self Care Home Management;Cryotherapy;Electrical Stimulation;Functional mobility training;Stair training;Gait training;Moist Heat;Therapeutic activities;Therapeutic exercise;Balance training;Neuromuscular re-education;Patient/family education;Passive range of motion;Manual techniques;Taping;Dry needling   Consulted and Agree with Plan of Care Patient      Patient will benefit from skilled therapeutic intervention in order to improve the following deficits and impairments:  Decreased knowledge of precautions, Decreased  strength  Visit Diagnosis: Muscle weakness (generalized)       G-Codes - 2017-02-19 1019    Functional Assessment Tool Used (Outpatient Only) clinical judgement, gross strength   Functional Limitation Mobility: Walking and moving around   Mobility: Walking and Moving Around Goal Status [phone]) At least 1 percent but less than 20 percent impaired, limited or restricted   Mobility: Walking and Moving Around Discharge Status [phone]) At least 1 percent but less than 20 percent impaired, limited or restricted      Problem List There are no active problems to display for this patient. PHYSICAL THERAPY DISCHARGE SUMMARY  Visits from Start of Care: 14  Current functional level related to goals / functional outcomes: See above   Remaining deficits: See above   Education / Equipment: Anatomy of condition, POC, HEP, exercise form/rationale  Plan: Patient agrees to discharge.  Patient goals were met. Patient is being discharged due to meeting the stated rehab goals.  ?????      C.  PT, DPT 02/19/2017 12:05 PM  [facility] [address] [place], [place], [address] Phone: [phone]   Fax:  [phone]  Name: Renee Smith MRN: [id number] Date of Birth: 1946/04/23

## 2017-03-03 DIAGNOSIS — Z5181 Encounter for therapeutic drug level monitoring: Secondary | ICD-10-CM | POA: Diagnosis not present

## 2017-03-03 DIAGNOSIS — E0500 Thyrotoxicosis with diffuse goiter without thyrotoxic crisis or storm: Secondary | ICD-10-CM | POA: Diagnosis not present

## 2017-03-03 DIAGNOSIS — E0590 Thyrotoxicosis, unspecified without thyrotoxic crisis or storm: Secondary | ICD-10-CM | POA: Diagnosis not present

## 2017-04-12 DIAGNOSIS — E0590 Thyrotoxicosis, unspecified without thyrotoxic crisis or storm: Secondary | ICD-10-CM | POA: Diagnosis not present

## 2017-04-28 DIAGNOSIS — Z23 Encounter for immunization: Secondary | ICD-10-CM | POA: Diagnosis not present

## 2017-05-13 DIAGNOSIS — E0500 Thyrotoxicosis with diffuse goiter without thyrotoxic crisis or storm: Secondary | ICD-10-CM | POA: Diagnosis not present

## 2017-05-18 DIAGNOSIS — Z8489 Family history of other specified conditions: Secondary | ICD-10-CM | POA: Diagnosis not present

## 2017-05-18 DIAGNOSIS — Z5181 Encounter for therapeutic drug level monitoring: Secondary | ICD-10-CM | POA: Diagnosis not present

## 2017-05-18 DIAGNOSIS — E0500 Thyrotoxicosis with diffuse goiter without thyrotoxic crisis or storm: Secondary | ICD-10-CM | POA: Diagnosis not present

## 2017-05-18 DIAGNOSIS — E042 Nontoxic multinodular goiter: Secondary | ICD-10-CM | POA: Diagnosis not present

## 2017-05-18 DIAGNOSIS — E0590 Thyrotoxicosis, unspecified without thyrotoxic crisis or storm: Secondary | ICD-10-CM | POA: Diagnosis not present

## 2017-05-18 DIAGNOSIS — M810 Age-related osteoporosis without current pathological fracture: Secondary | ICD-10-CM | POA: Diagnosis not present

## 2017-06-11 DIAGNOSIS — L84 Corns and callosities: Secondary | ICD-10-CM | POA: Diagnosis not present

## 2017-08-30 DIAGNOSIS — E0500 Thyrotoxicosis with diffuse goiter without thyrotoxic crisis or storm: Secondary | ICD-10-CM | POA: Diagnosis not present

## 2017-09-01 DIAGNOSIS — Z8489 Family history of other specified conditions: Secondary | ICD-10-CM | POA: Diagnosis not present

## 2017-09-01 DIAGNOSIS — Z5181 Encounter for therapeutic drug level monitoring: Secondary | ICD-10-CM | POA: Diagnosis not present

## 2017-09-01 DIAGNOSIS — E042 Nontoxic multinodular goiter: Secondary | ICD-10-CM | POA: Diagnosis not present

## 2017-09-01 DIAGNOSIS — E0500 Thyrotoxicosis with diffuse goiter without thyrotoxic crisis or storm: Secondary | ICD-10-CM | POA: Diagnosis not present

## 2017-09-01 DIAGNOSIS — M810 Age-related osteoporosis without current pathological fracture: Secondary | ICD-10-CM | POA: Diagnosis not present

## 2017-09-01 DIAGNOSIS — E0590 Thyrotoxicosis, unspecified without thyrotoxic crisis or storm: Secondary | ICD-10-CM | POA: Diagnosis not present

## 2017-09-29 DIAGNOSIS — E0500 Thyrotoxicosis with diffuse goiter without thyrotoxic crisis or storm: Secondary | ICD-10-CM | POA: Diagnosis not present

## 2017-09-29 DIAGNOSIS — E0590 Thyrotoxicosis, unspecified without thyrotoxic crisis or storm: Secondary | ICD-10-CM | POA: Diagnosis not present

## 2017-11-29 DIAGNOSIS — L309 Dermatitis, unspecified: Secondary | ICD-10-CM | POA: Diagnosis not present

## 2017-11-29 DIAGNOSIS — E0500 Thyrotoxicosis with diffuse goiter without thyrotoxic crisis or storm: Secondary | ICD-10-CM | POA: Diagnosis not present

## 2018-01-03 ENCOUNTER — Other Ambulatory Visit: Payer: Self-pay | Admitting: Obstetrics

## 2018-01-03 DIAGNOSIS — Z23 Encounter for immunization: Secondary | ICD-10-CM | POA: Diagnosis not present

## 2018-01-03 DIAGNOSIS — Z0000 Encounter for general adult medical examination without abnormal findings: Secondary | ICD-10-CM | POA: Diagnosis not present

## 2018-01-03 DIAGNOSIS — Z1231 Encounter for screening mammogram for malignant neoplasm of breast: Secondary | ICD-10-CM

## 2018-01-03 DIAGNOSIS — Z1389 Encounter for screening for other disorder: Secondary | ICD-10-CM | POA: Diagnosis not present

## 2018-02-21 ENCOUNTER — Ambulatory Visit
Admission: RE | Admit: 2018-02-21 | Discharge: 2018-02-21 | Disposition: A | Discharge status: Home / Self Care | Payer: Medicare Other | Source: Ambulatory Visit | Attending: Obstetrics | Admitting: Obstetrics

## 2018-02-21 DIAGNOSIS — Z1231 Encounter for screening mammogram for malignant neoplasm of breast: Secondary | ICD-10-CM | POA: Diagnosis not present

## 2018-04-13 DIAGNOSIS — Z23 Encounter for immunization: Secondary | ICD-10-CM | POA: Diagnosis not present

## 2018-06-13 ENCOUNTER — Other Ambulatory Visit: Payer: Self-pay | Admitting: Internal Medicine

## 2018-06-13 DIAGNOSIS — E042 Nontoxic multinodular goiter: Secondary | ICD-10-CM | POA: Diagnosis not present

## 2018-06-13 DIAGNOSIS — M810 Age-related osteoporosis without current pathological fracture: Secondary | ICD-10-CM

## 2018-06-13 DIAGNOSIS — E0590 Thyrotoxicosis, unspecified without thyrotoxic crisis or storm: Secondary | ICD-10-CM | POA: Diagnosis not present

## 2018-06-13 DIAGNOSIS — E0500 Thyrotoxicosis with diffuse goiter without thyrotoxic crisis or storm: Secondary | ICD-10-CM | POA: Diagnosis not present

## 2018-06-13 DIAGNOSIS — Z8489 Family history of other specified conditions: Secondary | ICD-10-CM | POA: Diagnosis not present

## 2018-06-13 DIAGNOSIS — Z5181 Encounter for therapeutic drug level monitoring: Secondary | ICD-10-CM | POA: Diagnosis not present

## 2018-07-14 DIAGNOSIS — E0590 Thyrotoxicosis, unspecified without thyrotoxic crisis or storm: Secondary | ICD-10-CM | POA: Diagnosis not present

## 2018-08-09 DIAGNOSIS — E0500 Thyrotoxicosis with diffuse goiter without thyrotoxic crisis or storm: Secondary | ICD-10-CM | POA: Diagnosis not present

## 2018-08-09 DIAGNOSIS — Z8669 Personal history of other diseases of the nervous system and sense organs: Secondary | ICD-10-CM | POA: Diagnosis not present

## 2018-08-09 DIAGNOSIS — Z9889 Other specified postprocedural states: Secondary | ICD-10-CM | POA: Diagnosis not present

## 2018-08-09 DIAGNOSIS — Z961 Presence of intraocular lens: Secondary | ICD-10-CM | POA: Diagnosis not present

## 2018-08-29 ENCOUNTER — Ambulatory Visit
Admission: RE | Admit: 2018-08-29 | Discharge: 2018-08-29 | Disposition: A | Discharge status: Home / Self Care | Payer: Medicare Other | Source: Ambulatory Visit | Attending: Internal Medicine | Admitting: Internal Medicine

## 2018-08-29 DIAGNOSIS — M810 Age-related osteoporosis without current pathological fracture: Secondary | ICD-10-CM

## 2018-08-30 ENCOUNTER — Other Ambulatory Visit: Payer: Self-pay | Admitting: Dermatology

## 2018-08-30 DIAGNOSIS — L309 Dermatitis, unspecified: Secondary | ICD-10-CM | POA: Diagnosis not present

## 2018-08-30 DIAGNOSIS — D229 Melanocytic nevi, unspecified: Secondary | ICD-10-CM | POA: Diagnosis not present

## 2018-08-30 DIAGNOSIS — L818 Other specified disorders of pigmentation: Secondary | ICD-10-CM | POA: Diagnosis not present

## 2018-08-30 DIAGNOSIS — D485 Neoplasm of uncertain behavior of skin: Secondary | ICD-10-CM | POA: Diagnosis not present

## 2018-08-30 DIAGNOSIS — T7840XA Allergy, unspecified, initial encounter: Secondary | ICD-10-CM | POA: Diagnosis not present

## 2018-08-30 DIAGNOSIS — L821 Other seborrheic keratosis: Secondary | ICD-10-CM | POA: Diagnosis not present

## 2018-09-01 ENCOUNTER — Other Ambulatory Visit: Payer: Medicare Other

## 2018-09-13 DIAGNOSIS — Z4802 Encounter for removal of sutures: Secondary | ICD-10-CM | POA: Diagnosis not present

## 2018-09-16 ENCOUNTER — Ambulatory Visit
Admission: RE | Admit: 2018-09-16 | Discharge: 2018-09-16 | Disposition: A | Discharge status: Home / Self Care | Payer: Medicare Other | Source: Ambulatory Visit | Attending: Internal Medicine | Admitting: Internal Medicine

## 2018-09-16 DIAGNOSIS — M810 Age-related osteoporosis without current pathological fracture: Secondary | ICD-10-CM | POA: Diagnosis not present

## 2018-09-16 DIAGNOSIS — Z780 Asymptomatic menopausal state: Secondary | ICD-10-CM | POA: Diagnosis not present

## 2018-12-07 DIAGNOSIS — E0590 Thyrotoxicosis, unspecified without thyrotoxic crisis or storm: Secondary | ICD-10-CM | POA: Diagnosis not present

## 2018-12-14 DIAGNOSIS — E042 Nontoxic multinodular goiter: Secondary | ICD-10-CM | POA: Diagnosis not present

## 2018-12-14 DIAGNOSIS — M810 Age-related osteoporosis without current pathological fracture: Secondary | ICD-10-CM | POA: Diagnosis not present

## 2018-12-14 DIAGNOSIS — Z8489 Family history of other specified conditions: Secondary | ICD-10-CM | POA: Diagnosis not present

## 2018-12-14 DIAGNOSIS — E0500 Thyrotoxicosis with diffuse goiter without thyrotoxic crisis or storm: Secondary | ICD-10-CM | POA: Diagnosis not present

## 2018-12-14 DIAGNOSIS — Z5181 Encounter for therapeutic drug level monitoring: Secondary | ICD-10-CM | POA: Diagnosis not present

## 2019-02-02 DIAGNOSIS — H40013 Open angle with borderline findings, low risk, bilateral: Secondary | ICD-10-CM | POA: Diagnosis not present

## 2019-03-30 DIAGNOSIS — Z23 Encounter for immunization: Secondary | ICD-10-CM | POA: Diagnosis not present

## 2019-04-07 ENCOUNTER — Other Ambulatory Visit: Payer: Self-pay | Admitting: Obstetrics

## 2019-04-07 DIAGNOSIS — Z1231 Encounter for screening mammogram for malignant neoplasm of breast: Secondary | ICD-10-CM

## 2019-04-13 ENCOUNTER — Ambulatory Visit
Admission: RE | Admit: 2019-04-13 | Discharge: 2019-04-13 | Disposition: A | Discharge status: Home / Self Care | Payer: Medicare Other | Source: Ambulatory Visit | Attending: Obstetrics | Admitting: Obstetrics

## 2019-04-13 ENCOUNTER — Other Ambulatory Visit: Payer: Self-pay

## 2019-04-13 DIAGNOSIS — Z1231 Encounter for screening mammogram for malignant neoplasm of breast: Secondary | ICD-10-CM

## 2019-07-25 ENCOUNTER — Ambulatory Visit: Payer: Medicare Other | Attending: Internal Medicine

## 2019-07-25 DIAGNOSIS — Z23 Encounter for immunization: Secondary | ICD-10-CM | POA: Diagnosis not present

## 2019-07-25 NOTE — Progress Notes (Signed)
   Covid-19 Vaccination Clinic  Name:  Renee Smith    MRN: [id number] DOB: [birth year]  07/25/2019  Renee Smith was observed post Covid-19 immunization for 15 minutes without incidence. She was provided with Vaccine Information Sheet and instruction to access the V-Safe system.   Renee Smith was instructed to call 911 with any severe reactions post vaccine: Marland Kitchen Difficulty breathing  . Swelling of your face and throat  . A fast heartbeat  . A bad rash all over your body  . Dizziness and weakness    Immunizations Administered    Name Date Dose VIS Date Route   Pfizer COVID-19 Vaccine 07/25/2019 10:35 AM 0.3 mL 06/23/2019 Intramuscular   Manufacturer: [employer]   Lot: [id number]   [place]: [id number]

## 2019-07-27 ENCOUNTER — Other Ambulatory Visit: Payer: Self-pay | Admitting: Geriatric Medicine

## 2019-07-27 DIAGNOSIS — E0500 Thyrotoxicosis with diffuse goiter without thyrotoxic crisis or storm: Secondary | ICD-10-CM | POA: Diagnosis not present

## 2019-07-27 DIAGNOSIS — E042 Nontoxic multinodular goiter: Secondary | ICD-10-CM | POA: Diagnosis not present

## 2019-07-27 DIAGNOSIS — Z79899 Other long term (current) drug therapy: Secondary | ICD-10-CM | POA: Diagnosis not present

## 2019-07-27 DIAGNOSIS — Z0000 Encounter for general adult medical examination without abnormal findings: Secondary | ICD-10-CM | POA: Diagnosis not present

## 2019-07-27 DIAGNOSIS — Z1389 Encounter for screening for other disorder: Secondary | ICD-10-CM | POA: Diagnosis not present

## 2019-07-27 DIAGNOSIS — E7800 Pure hypercholesterolemia, unspecified: Secondary | ICD-10-CM | POA: Diagnosis not present

## 2019-07-27 DIAGNOSIS — M810 Age-related osteoporosis without current pathological fracture: Secondary | ICD-10-CM | POA: Diagnosis not present

## 2019-08-08 DIAGNOSIS — H40013 Open angle with borderline findings, low risk, bilateral: Secondary | ICD-10-CM | POA: Diagnosis not present

## 2019-08-08 DIAGNOSIS — Z961 Presence of intraocular lens: Secondary | ICD-10-CM | POA: Diagnosis not present

## 2019-08-14 ENCOUNTER — Ambulatory Visit: Payer: Medicare Other | Attending: Internal Medicine

## 2019-08-14 DIAGNOSIS — Z23 Encounter for immunization: Secondary | ICD-10-CM | POA: Insufficient documentation

## 2019-08-14 NOTE — Progress Notes (Signed)
   Covid-19 Vaccination Clinic  Name:  Renee Smith    MRN: [id number] DOB: [birth year]  08/14/2019  [name] was observed post Covid-19 immunization for 15 minutes without incidence. She was provided with Vaccine Information Sheet and instruction to access the V-Safe system.   [name] was instructed to call 911 with any severe reactions post vaccine: Marland Kitchen Difficulty breathing  . Swelling of your face and throat  . A fast heartbeat  . A bad rash all over your body  . Dizziness and weakness    Immunizations Administered    Name Date Dose VIS Date Route   Pfizer COVID-19 Vaccine 08/14/2019  9:55 AM 0.3 mL 06/23/2019 Intramuscular   Manufacturer: [place]   Lot: [id number]   [place]: [id number]

## 2019-08-29 DIAGNOSIS — N952 Postmenopausal atrophic vaginitis: Secondary | ICD-10-CM | POA: Diagnosis not present

## 2019-08-29 DIAGNOSIS — K6289 Other specified diseases of anus and rectum: Secondary | ICD-10-CM | POA: Diagnosis not present

## 2019-08-29 DIAGNOSIS — M810 Age-related osteoporosis without current pathological fracture: Secondary | ICD-10-CM | POA: Diagnosis not present

## 2019-08-29 DIAGNOSIS — Z124 Encounter for screening for malignant neoplasm of cervix: Secondary | ICD-10-CM | POA: Diagnosis not present

## 2019-08-29 DIAGNOSIS — N760 Acute vaginitis: Secondary | ICD-10-CM | POA: Diagnosis not present

## 2019-08-29 DIAGNOSIS — Z01419 Encounter for gynecological examination (general) (routine) without abnormal findings: Secondary | ICD-10-CM | POA: Diagnosis not present

## 2019-09-04 ENCOUNTER — Ambulatory Visit
Admission: RE | Admit: 2019-09-04 | Discharge: 2019-09-04 | Disposition: A | Discharge status: Home / Self Care | Payer: Medicare Other | Source: Ambulatory Visit | Attending: Geriatric Medicine | Admitting: Geriatric Medicine

## 2019-09-04 ENCOUNTER — Other Ambulatory Visit: Payer: Self-pay

## 2019-09-04 DIAGNOSIS — E042 Nontoxic multinodular goiter: Secondary | ICD-10-CM

## 2019-09-04 DIAGNOSIS — E041 Nontoxic single thyroid nodule: Secondary | ICD-10-CM | POA: Diagnosis not present

## 2019-10-26 ENCOUNTER — Ambulatory Visit (INDEPENDENT_AMBULATORY_CARE_PROVIDER_SITE_OTHER): Payer: Medicare Other | Admitting: Podiatry

## 2019-10-26 ENCOUNTER — Ambulatory Visit (INDEPENDENT_AMBULATORY_CARE_PROVIDER_SITE_OTHER): Payer: Medicare Other

## 2019-10-26 ENCOUNTER — Other Ambulatory Visit: Payer: Self-pay | Admitting: Podiatry

## 2019-10-26 ENCOUNTER — Other Ambulatory Visit: Payer: Self-pay

## 2019-10-26 DIAGNOSIS — M2042 Other hammer toe(s) (acquired), left foot: Secondary | ICD-10-CM

## 2019-10-26 DIAGNOSIS — M25872 Other specified joint disorders, left ankle and foot: Secondary | ICD-10-CM

## 2019-10-26 DIAGNOSIS — M7752 Other enthesopathy of left foot: Secondary | ICD-10-CM | POA: Diagnosis not present

## 2019-10-26 DIAGNOSIS — M79672 Pain in left foot: Secondary | ICD-10-CM

## 2019-11-09 ENCOUNTER — Ambulatory Visit: Payer: Self-pay | Admitting: Podiatry

## 2019-11-16 DIAGNOSIS — N952 Postmenopausal atrophic vaginitis: Secondary | ICD-10-CM | POA: Diagnosis not present

## 2019-11-16 DIAGNOSIS — L293 Anogenital pruritus, unspecified: Secondary | ICD-10-CM | POA: Diagnosis not present

## 2019-11-16 DIAGNOSIS — K6289 Other specified diseases of anus and rectum: Secondary | ICD-10-CM | POA: Diagnosis not present

## 2019-11-17 ENCOUNTER — Ambulatory Visit (INDEPENDENT_AMBULATORY_CARE_PROVIDER_SITE_OTHER): Payer: Medicare Other | Admitting: Podiatry

## 2019-11-17 ENCOUNTER — Other Ambulatory Visit: Payer: Self-pay

## 2019-11-17 DIAGNOSIS — M7752 Other enthesopathy of left foot: Secondary | ICD-10-CM

## 2019-12-08 ENCOUNTER — Ambulatory Visit (INDEPENDENT_AMBULATORY_CARE_PROVIDER_SITE_OTHER): Payer: Medicare Other | Admitting: Podiatry

## 2019-12-08 ENCOUNTER — Other Ambulatory Visit: Payer: Self-pay

## 2019-12-08 DIAGNOSIS — M7752 Other enthesopathy of left foot: Secondary | ICD-10-CM | POA: Diagnosis not present

## 2019-12-08 DIAGNOSIS — M2042 Other hammer toe(s) (acquired), left foot: Secondary | ICD-10-CM | POA: Diagnosis not present

## 2019-12-08 DIAGNOSIS — M25872 Other specified joint disorders, left ankle and foot: Secondary | ICD-10-CM | POA: Diagnosis not present

## 2019-12-08 NOTE — Progress Notes (Signed)
  Subjective:  Patient ID: Renee Smith, female    DOB: [birth year],  MRN: [id number]  Chief Complaint  Patient presents with  . New Patient (Initial Visit)  . Foot Pain    left foot , ball area. swelling, redness. states been ongoing for a week. has not treated it. constant pain. hurts most when "planking"     74 y.o. female presents with the above complaint. History confirmed with patient.   Objective:  Physical Exam: warm, good capillary refill, no trophic changes or ulcerative lesions, normal DP and PT pulses and normal sensory exam. Left Foot: POP left 2nd MPJ, +modified lachman 2nd MPJ, hammertoe 2nd toe  Right Foot: normal exam, no swelling, tenderness, instability; ligaments intact, full range of motion of all ankle/foot joints   No images are attached to the encounter.  Radiographs: X-ray of the left foot: no fracture, dislocation, swelling or degenerative changes noted Assessment:   1. Capsulitis of metatarsophalangeal (MTP) joint of left foot   2. Hammertoe of left foot   3. 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  Left 2nd MPJ Capsulitis, Hammertoe, Pre-dislocation syndrome -Educated on etiology -Educated on taping digit in plantarflexion. -XR reviewed with patient -Discussed padding and proper shoegear -Injection delivered to the painful joint  Procedure: Joint Injection Location: Left 2nd MPJ joint Skin Prep: Alcohol. Injectate: 0.5 cc 1% lidocaine plain, 0.5 cc dexamethasone phosphate. Disposition: Patient tolerated procedure well. Injection site dressed with a band-aid.   No follow-ups on file.

## 2019-12-08 NOTE — Progress Notes (Signed)
  Subjective:  Patient ID: Renee Smith, female    DOB: 30-Sep-1945,  MRN: [id number]  Chief Complaint  Patient presents with  . Foot Pain    pt is here for left foot pain, pt states that the left foot is doing a lot better since the last time she was here, pt also states that the injection she recieved last time has not been helping    74 y.o. female presents with the above complaint. History confirmed with patient.   Objective:  Physical Exam: warm, good capillary refill, no trophic changes or ulcerative lesions, normal DP and PT pulses and normal sensory exam. Left Foot: POP left 2nd MPJ, +modified lachman 2nd MPJ, hammertoe 2nd toe  Right Foot: normal exam, no swelling, tenderness, instability; ligaments intact, full range of motion of all ankle/foot joints   Assessment:   1. Capsulitis of metatarsophalangeal (MTP) joint of left foot    Plan:  Patient was evaluated and treated and all questions answered.  Left 2nd MPJ Capsulitis, Hammertoe, Pre-dislocation syndrome -Continue taping digit in plantarflexion. -Repeat injection delivered to the painful joint  Return in about 3 weeks (around 12/08/2019) for Capsulitis, metarsalgia.

## 2019-12-15 DIAGNOSIS — E041 Nontoxic single thyroid nodule: Secondary | ICD-10-CM | POA: Diagnosis not present

## 2019-12-31 NOTE — Progress Notes (Signed)
  Subjective:  Patient ID: Renee Smith, female    DOB: 10-Jan-1946,  MRN: [id number]  Chief Complaint  Patient presents with  . Toe Pain    Pt states experiencing less progress than previouis visits; taping is causing some peeling skin plantar forefoot and seems to not help the position of the toe.   74 y.o. female presents with the above complaint. History confirmed with patient.   Objective:  Physical Exam: warm, good capillary refill, no trophic changes or ulcerative lesions, normal DP and PT pulses and normal sensory exam. Left Foot: POP left 2nd MPJ, +modified lachman 2nd MPJ, hammertoe 2nd toe  Right Foot: normal exam, no swelling, tenderness, instability; ligaments intact, full range of motion of all ankle/foot joints   Assessment:   1. Capsulitis of metatarsophalangeal (MTP) joint of left foot   2. Hammertoe of left foot   3. 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  Left 2nd MPJ Capsulitis, Hammertoe, Pre-dislocation syndrome -Defer injection today -Discussed possible surgical intervention should pain persist. -Continue taping  No follow-ups on file.

## 2020-01-24 ENCOUNTER — Encounter: Payer: Self-pay | Admitting: Podiatry

## 2020-01-31 DIAGNOSIS — M2042 Other hammer toe(s) (acquired), left foot: Secondary | ICD-10-CM | POA: Diagnosis not present

## 2020-01-31 DIAGNOSIS — M7742 Metatarsalgia, left foot: Secondary | ICD-10-CM | POA: Diagnosis not present

## 2020-01-31 DIAGNOSIS — M21612 Bunion of left foot: Secondary | ICD-10-CM | POA: Diagnosis not present

## 2020-01-31 DIAGNOSIS — M79672 Pain in left foot: Secondary | ICD-10-CM | POA: Diagnosis not present

## 2020-03-08 DIAGNOSIS — Z23 Encounter for immunization: Secondary | ICD-10-CM | POA: Diagnosis not present

## 2020-04-01 ENCOUNTER — Ambulatory Visit (INDEPENDENT_AMBULATORY_CARE_PROVIDER_SITE_OTHER): Payer: Medicare Other | Admitting: Dermatology

## 2020-04-01 ENCOUNTER — Other Ambulatory Visit: Payer: Self-pay | Admitting: Obstetrics

## 2020-04-01 ENCOUNTER — Encounter: Payer: Self-pay | Admitting: Dermatology

## 2020-04-01 ENCOUNTER — Other Ambulatory Visit: Payer: Self-pay

## 2020-04-01 DIAGNOSIS — D485 Neoplasm of uncertain behavior of skin: Secondary | ICD-10-CM | POA: Diagnosis not present

## 2020-04-01 DIAGNOSIS — D229 Melanocytic nevi, unspecified: Secondary | ICD-10-CM

## 2020-04-01 DIAGNOSIS — D225 Melanocytic nevi of trunk: Secondary | ICD-10-CM | POA: Diagnosis not present

## 2020-04-01 DIAGNOSIS — L821 Other seborrheic keratosis: Secondary | ICD-10-CM

## 2020-04-01 DIAGNOSIS — Z1283 Encounter for screening for malignant neoplasm of skin: Secondary | ICD-10-CM

## 2020-04-01 DIAGNOSIS — D1801 Hemangioma of skin and subcutaneous tissue: Secondary | ICD-10-CM | POA: Diagnosis not present

## 2020-04-01 DIAGNOSIS — Z1231 Encounter for screening mammogram for malignant neoplasm of breast: Secondary | ICD-10-CM

## 2020-04-01 NOTE — Patient Instructions (Signed)

## 2020-04-01 NOTE — Progress Notes (Signed)
   Follow-Up Visit   Subjective  Renee Smith is a 74 y.o. female who presents for the following: Annual Exam (back- few places want checked).  Growth Location: Right leg Duration:  Quality:  Associated Signs/Symptoms: Modifying Factors:  Severity:  Timing: Context:   Objective  Well appearing patient in no apparent distress; mood and affect are within normal limits.  A focused examination was performed including Head, neck, back, chest, abdomen, legs, arms.. Relevant physical exam findings are noted in the Assessment and Plan.   Assessment & Plan    Neoplasm of uncertain behavior of skin Right Lower Leg - Anterior  Skin / nail biopsy Type of biopsy: tangential   Informed consent: discussed and consent obtained   Timeout: patient name, date of birth, surgical site, and procedure verified   Procedure prep:  Patient was prepped and draped in usual sterile fashion Prep type:  Chlorhexidine Anesthesia: the lesion was anesthetized in a standard fashion   Anesthetic:  1% lidocaine w/ epinephrine 1-100,000 local infiltration Instrument used: flexible razor blade   Hemostasis achieved with: ferric subsulfate   Outcome: patient tolerated procedure well   Post-procedure details: wound care instructions given    Specimen 1 - Surgical pathology Differential Diagnosis: r/o angioma Check Margins: No  Seborrheic keratosis (3) Left Breast; Right Breast; Mid Back  Leave if stable.  Nevus Left Lower Back  Annual skin examination.     I, Lavonna Monarch, MD, have reviewed all documentation for this visit.  The documentation on 04/01/20 for the exam, diagnosis, procedures, and orders are all accurate and complete.

## 2020-04-15 ENCOUNTER — Other Ambulatory Visit: Payer: Self-pay

## 2020-04-15 ENCOUNTER — Ambulatory Visit
Admission: RE | Admit: 2020-04-15 | Discharge: 2020-04-15 | Disposition: A | Discharge status: Home / Self Care | Payer: Medicare Other | Source: Ambulatory Visit | Attending: Obstetrics | Admitting: Obstetrics

## 2020-04-15 DIAGNOSIS — Z1231 Encounter for screening mammogram for malignant neoplasm of breast: Secondary | ICD-10-CM | POA: Diagnosis not present

## 2020-04-25 DIAGNOSIS — Z23 Encounter for immunization: Secondary | ICD-10-CM | POA: Diagnosis not present

## 2020-05-23 IMAGING — MG DIGITAL SCREENING BILATERAL MAMMOGRAM WITH TOMO AND CAD
8 series · 9 of 24 positions shown · non-contrast
Comparison: Previous exam(s).

CLINICAL DATA: Screening.

EXAM:
DIGITAL SCREENING BILATERAL MAMMOGRAM WITH TOMO AND CAD

[L CC synth-2D]
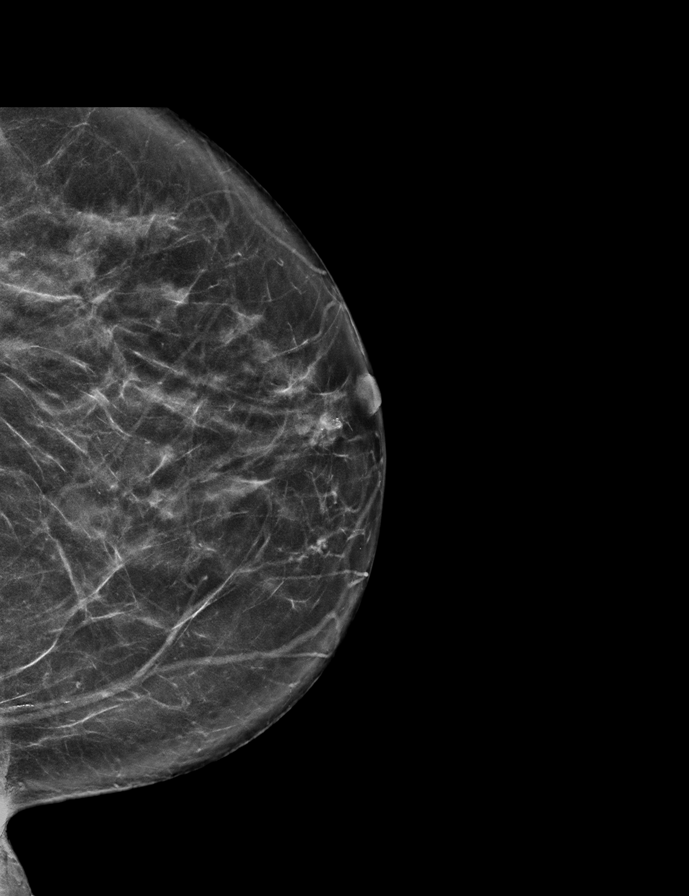

[L MLO synth-2D]
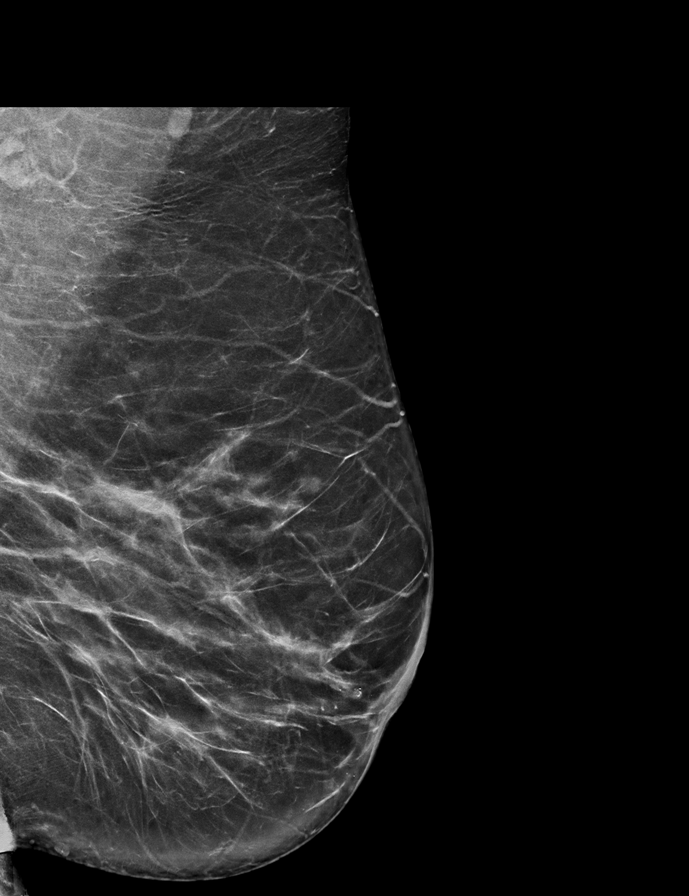

[R MLO synth-2D]
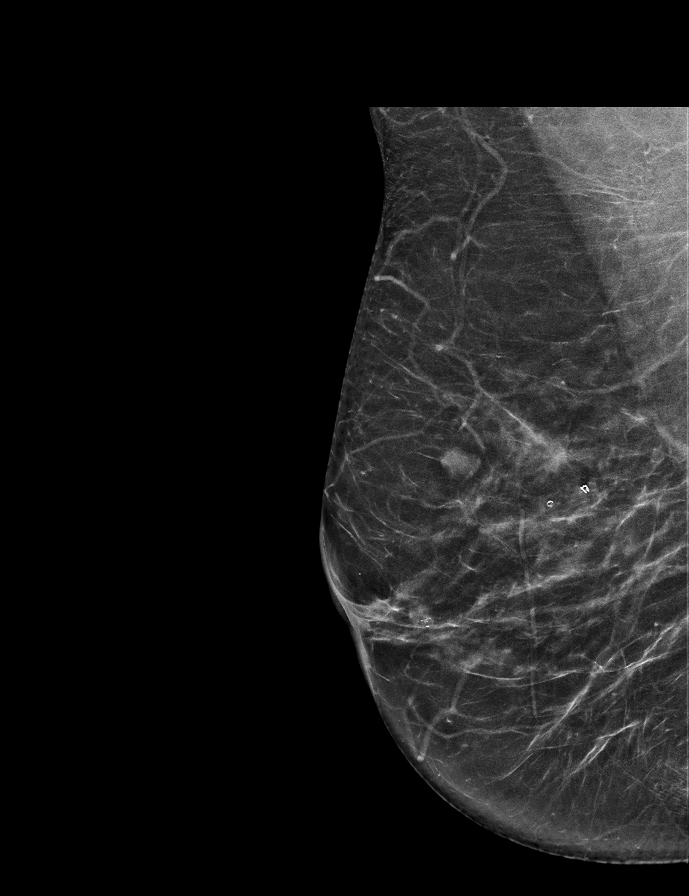

[R CC synth-2D]
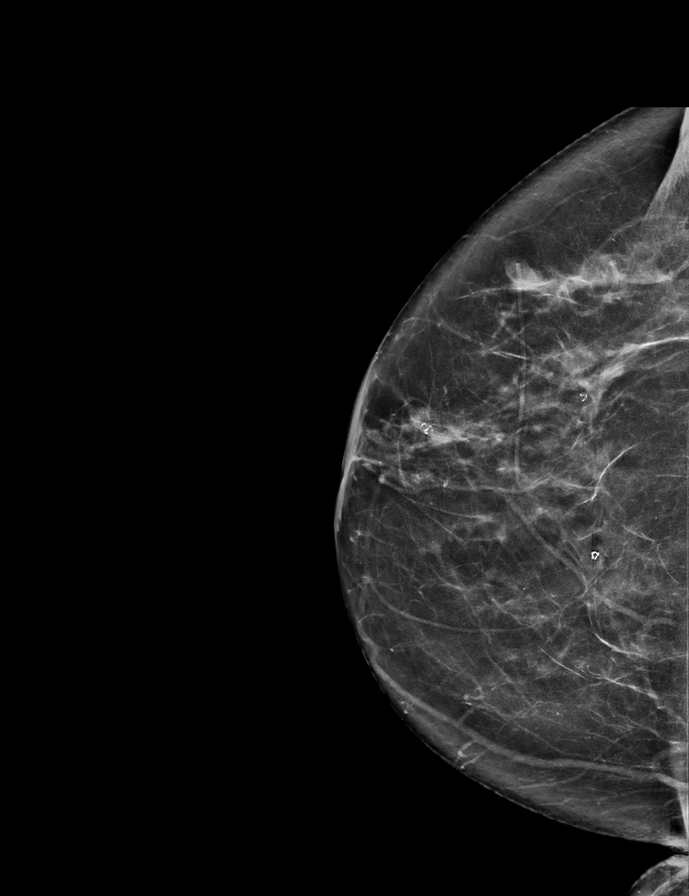

[L MLO tomo · 2 of 74 frames shown]
[frame 24/74]
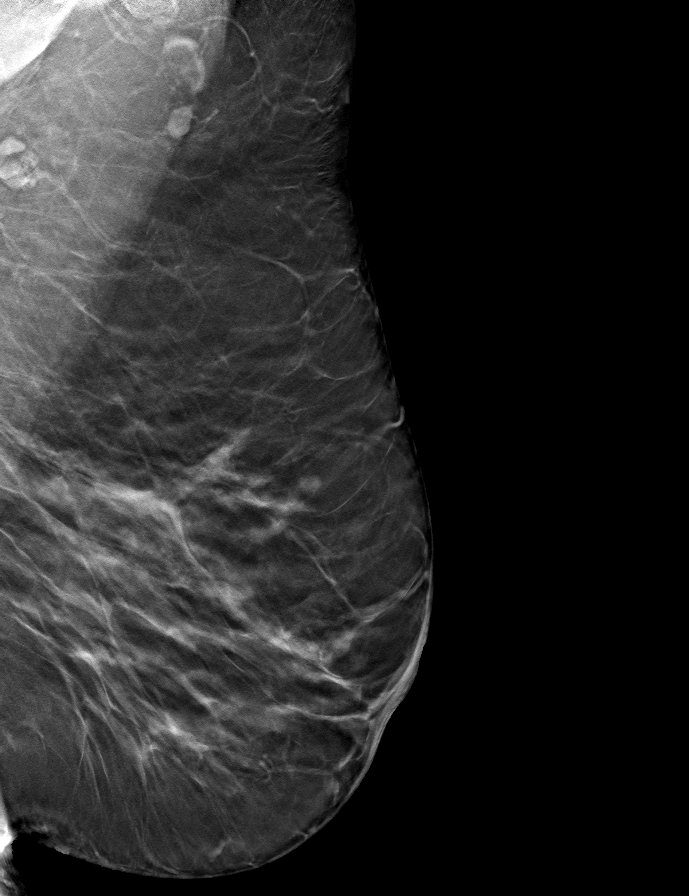
[frame 37/74]
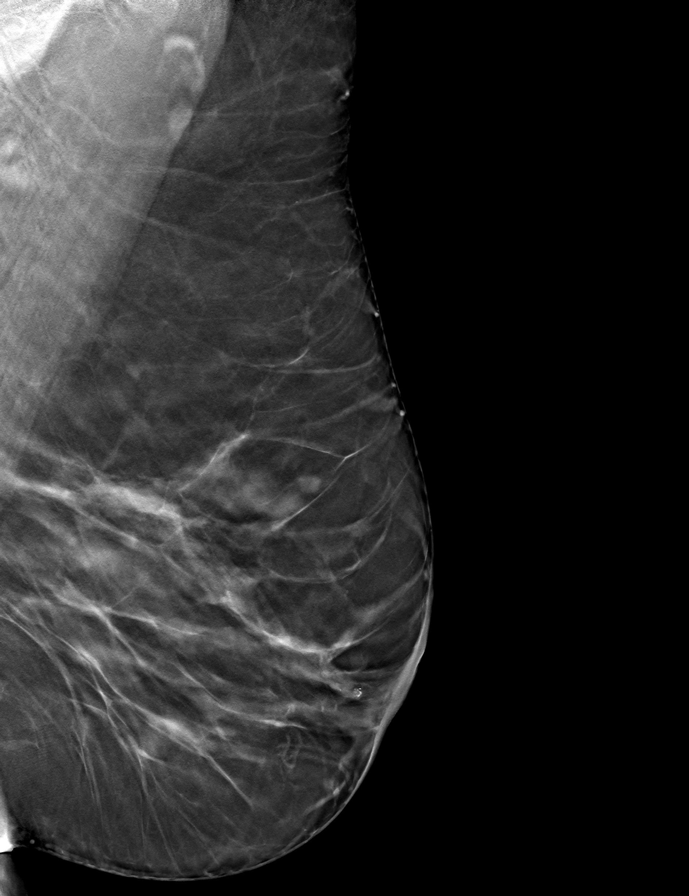

[L CC tomo · tomo slice 33/66.0]
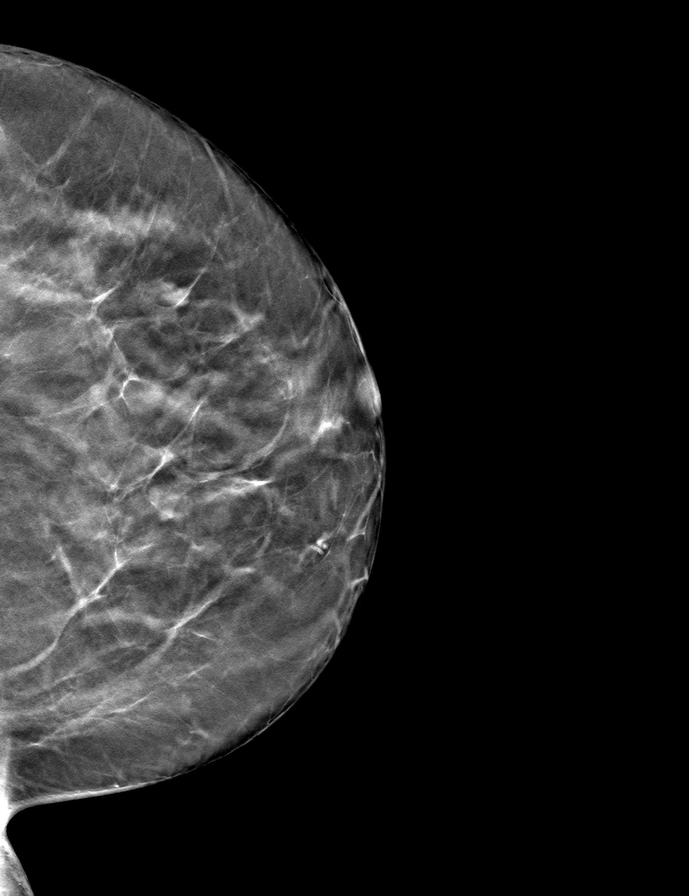

[R MLO tomo · tomo slice 33/65.0]
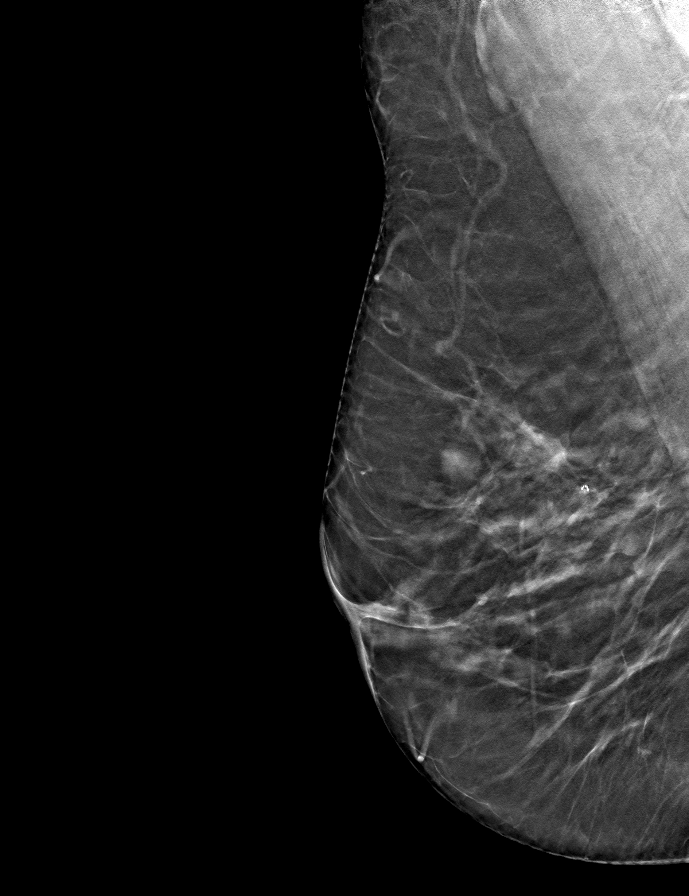

[R CC tomo · tomo slice 34/67.0]
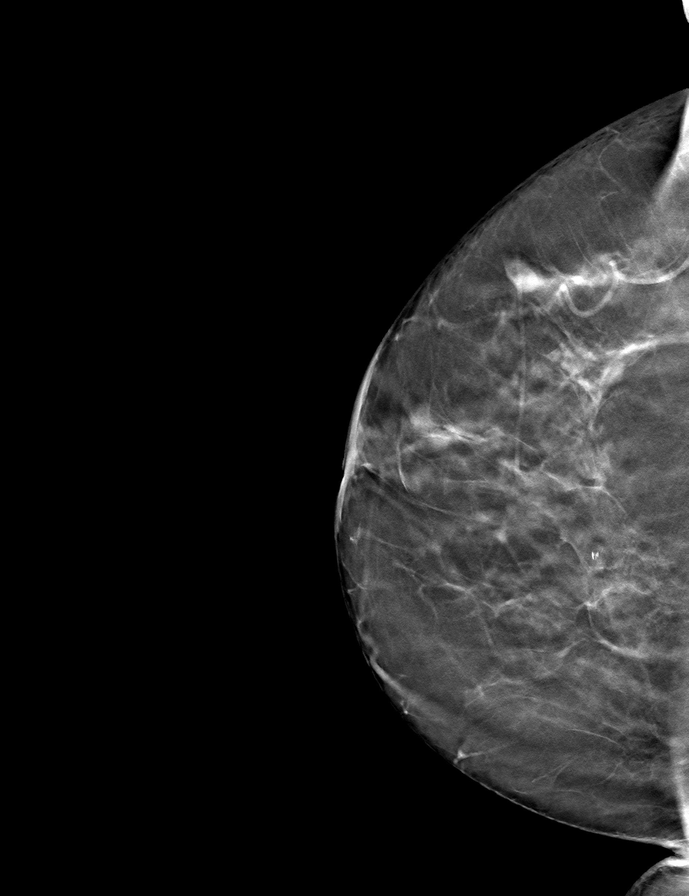

[9 of 24 positions shown; findings below may reference images not displayed]

ACR Breast Density Category b: There are scattered areas of
fibroglandular density.
FINDINGS: There are no findings suspicious for malignancy. Images were
processed with CAD.
IMPRESSION: No mammographic evidence of malignancy. A result letter of this
screening mammogram will be mailed directly to the patient.

RECOMMENDATION:
Screening mammogram in one year. (Code:[id number])

BI-RADS CATEGORY  1: Negative.

## 2020-08-08 DIAGNOSIS — Z961 Presence of intraocular lens: Secondary | ICD-10-CM | POA: Diagnosis not present

## 2020-08-08 DIAGNOSIS — H40013 Open angle with borderline findings, low risk, bilateral: Secondary | ICD-10-CM | POA: Diagnosis not present

## 2020-08-09 DIAGNOSIS — Z0000 Encounter for general adult medical examination without abnormal findings: Secondary | ICD-10-CM | POA: Diagnosis not present

## 2020-08-09 DIAGNOSIS — Z1389 Encounter for screening for other disorder: Secondary | ICD-10-CM | POA: Diagnosis not present

## 2020-08-09 DIAGNOSIS — E0500 Thyrotoxicosis with diffuse goiter without thyrotoxic crisis or storm: Secondary | ICD-10-CM | POA: Diagnosis not present

## 2020-08-09 DIAGNOSIS — E7800 Pure hypercholesterolemia, unspecified: Secondary | ICD-10-CM | POA: Diagnosis not present

## 2020-08-09 DIAGNOSIS — Z79899 Other long term (current) drug therapy: Secondary | ICD-10-CM | POA: Diagnosis not present

## 2020-08-09 DIAGNOSIS — M810 Age-related osteoporosis without current pathological fracture: Secondary | ICD-10-CM | POA: Diagnosis not present

## 2020-08-09 DIAGNOSIS — E042 Nontoxic multinodular goiter: Secondary | ICD-10-CM | POA: Diagnosis not present

## 2020-08-12 ENCOUNTER — Other Ambulatory Visit: Payer: Self-pay | Admitting: Geriatric Medicine

## 2020-08-12 DIAGNOSIS — E7800 Pure hypercholesterolemia, unspecified: Secondary | ICD-10-CM

## 2020-08-27 ENCOUNTER — Ambulatory Visit
Admission: RE | Admit: 2020-08-27 | Discharge: 2020-08-27 | Disposition: A | Discharge status: Home / Self Care | Payer: No Typology Code available for payment source | Source: Ambulatory Visit | Attending: Geriatric Medicine | Admitting: Geriatric Medicine

## 2020-08-27 DIAGNOSIS — E7800 Pure hypercholesterolemia, unspecified: Secondary | ICD-10-CM

## 2020-10-04 DIAGNOSIS — Z23 Encounter for immunization: Secondary | ICD-10-CM | POA: Diagnosis not present

## 2020-11-07 ENCOUNTER — Other Ambulatory Visit (HOSPITAL_COMMUNITY): Payer: Self-pay

## 2020-11-07 MED ORDER — NIRMATRELVIR/RITONAVIR (PAXLOVID)TABLET
ORAL_TABLET | ORAL | 0 refills | Status: DC
  Filled 2020-11-07: qty 30, 5d supply, fill #0

## 2020-11-18 ENCOUNTER — Other Ambulatory Visit (HOSPITAL_COMMUNITY): Payer: Self-pay

## 2021-04-14 DIAGNOSIS — M5416 Radiculopathy, lumbar region: Secondary | ICD-10-CM | POA: Diagnosis not present

## 2021-05-05 DIAGNOSIS — Z23 Encounter for immunization: Secondary | ICD-10-CM | POA: Diagnosis not present

## 2021-05-05 DIAGNOSIS — Z20822 Contact with and (suspected) exposure to covid-19: Secondary | ICD-10-CM | POA: Diagnosis not present

## 2021-05-29 DIAGNOSIS — M545 Low back pain, unspecified: Secondary | ICD-10-CM | POA: Diagnosis not present

## 2021-06-17 DIAGNOSIS — M25552 Pain in left hip: Secondary | ICD-10-CM | POA: Diagnosis not present

## 2021-06-17 DIAGNOSIS — M545 Low back pain, unspecified: Secondary | ICD-10-CM | POA: Diagnosis not present

## 2021-06-24 DIAGNOSIS — M5416 Radiculopathy, lumbar region: Secondary | ICD-10-CM | POA: Diagnosis not present

## 2021-06-24 DIAGNOSIS — M47896 Other spondylosis, lumbar region: Secondary | ICD-10-CM | POA: Diagnosis not present

## 2021-06-24 DIAGNOSIS — M6281 Muscle weakness (generalized): Secondary | ICD-10-CM | POA: Diagnosis not present

## 2021-06-26 DIAGNOSIS — M47896 Other spondylosis, lumbar region: Secondary | ICD-10-CM | POA: Diagnosis not present

## 2021-06-26 DIAGNOSIS — M6281 Muscle weakness (generalized): Secondary | ICD-10-CM | POA: Diagnosis not present

## 2021-06-26 DIAGNOSIS — M5416 Radiculopathy, lumbar region: Secondary | ICD-10-CM | POA: Diagnosis not present

## 2021-07-02 DIAGNOSIS — M47896 Other spondylosis, lumbar region: Secondary | ICD-10-CM | POA: Diagnosis not present

## 2021-07-02 DIAGNOSIS — M5416 Radiculopathy, lumbar region: Secondary | ICD-10-CM | POA: Diagnosis not present

## 2021-07-02 DIAGNOSIS — M6281 Muscle weakness (generalized): Secondary | ICD-10-CM | POA: Diagnosis not present

## 2021-07-04 DIAGNOSIS — M5416 Radiculopathy, lumbar region: Secondary | ICD-10-CM | POA: Diagnosis not present

## 2021-07-04 DIAGNOSIS — M6281 Muscle weakness (generalized): Secondary | ICD-10-CM | POA: Diagnosis not present

## 2021-07-04 DIAGNOSIS — M47896 Other spondylosis, lumbar region: Secondary | ICD-10-CM | POA: Diagnosis not present

## 2021-07-09 DIAGNOSIS — M47896 Other spondylosis, lumbar region: Secondary | ICD-10-CM | POA: Diagnosis not present

## 2021-07-09 DIAGNOSIS — M5416 Radiculopathy, lumbar region: Secondary | ICD-10-CM | POA: Diagnosis not present

## 2021-07-09 DIAGNOSIS — M6281 Muscle weakness (generalized): Secondary | ICD-10-CM | POA: Diagnosis not present

## 2021-07-16 DIAGNOSIS — M6281 Muscle weakness (generalized): Secondary | ICD-10-CM | POA: Diagnosis not present

## 2021-07-16 DIAGNOSIS — M5416 Radiculopathy, lumbar region: Secondary | ICD-10-CM | POA: Diagnosis not present

## 2021-07-16 DIAGNOSIS — M47896 Other spondylosis, lumbar region: Secondary | ICD-10-CM | POA: Diagnosis not present

## 2021-07-25 DIAGNOSIS — M5416 Radiculopathy, lumbar region: Secondary | ICD-10-CM | POA: Diagnosis not present

## 2021-07-25 DIAGNOSIS — M47896 Other spondylosis, lumbar region: Secondary | ICD-10-CM | POA: Diagnosis not present

## 2021-07-25 DIAGNOSIS — M6281 Muscle weakness (generalized): Secondary | ICD-10-CM | POA: Diagnosis not present

## 2021-07-29 DIAGNOSIS — M47896 Other spondylosis, lumbar region: Secondary | ICD-10-CM | POA: Diagnosis not present

## 2021-07-29 DIAGNOSIS — M6281 Muscle weakness (generalized): Secondary | ICD-10-CM | POA: Diagnosis not present

## 2021-07-29 DIAGNOSIS — M5416 Radiculopathy, lumbar region: Secondary | ICD-10-CM | POA: Diagnosis not present

## 2021-08-01 DIAGNOSIS — M47896 Other spondylosis, lumbar region: Secondary | ICD-10-CM | POA: Diagnosis not present

## 2021-08-01 DIAGNOSIS — M6281 Muscle weakness (generalized): Secondary | ICD-10-CM | POA: Diagnosis not present

## 2021-08-01 DIAGNOSIS — M5416 Radiculopathy, lumbar region: Secondary | ICD-10-CM | POA: Diagnosis not present

## 2021-08-04 DIAGNOSIS — M47816 Spondylosis without myelopathy or radiculopathy, lumbar region: Secondary | ICD-10-CM | POA: Diagnosis not present

## 2021-08-06 DIAGNOSIS — M47896 Other spondylosis, lumbar region: Secondary | ICD-10-CM | POA: Diagnosis not present

## 2021-08-06 DIAGNOSIS — M6281 Muscle weakness (generalized): Secondary | ICD-10-CM | POA: Diagnosis not present

## 2021-08-06 DIAGNOSIS — M5416 Radiculopathy, lumbar region: Secondary | ICD-10-CM | POA: Diagnosis not present

## 2021-09-19 DIAGNOSIS — Z79899 Other long term (current) drug therapy: Secondary | ICD-10-CM | POA: Diagnosis not present

## 2021-09-19 DIAGNOSIS — E7800 Pure hypercholesterolemia, unspecified: Secondary | ICD-10-CM | POA: Diagnosis not present

## 2021-09-19 DIAGNOSIS — E042 Nontoxic multinodular goiter: Secondary | ICD-10-CM | POA: Diagnosis not present

## 2021-09-19 DIAGNOSIS — M810 Age-related osteoporosis without current pathological fracture: Secondary | ICD-10-CM | POA: Diagnosis not present

## 2021-09-19 DIAGNOSIS — Z0000 Encounter for general adult medical examination without abnormal findings: Secondary | ICD-10-CM | POA: Diagnosis not present

## 2021-09-19 DIAGNOSIS — E0500 Thyrotoxicosis with diffuse goiter without thyrotoxic crisis or storm: Secondary | ICD-10-CM | POA: Diagnosis not present

## 2021-09-19 DIAGNOSIS — Z1389 Encounter for screening for other disorder: Secondary | ICD-10-CM | POA: Diagnosis not present

## 2022-04-25 DIAGNOSIS — Z23 Encounter for immunization: Secondary | ICD-10-CM | POA: Diagnosis not present

## 2022-05-07 DIAGNOSIS — Z23 Encounter for immunization: Secondary | ICD-10-CM | POA: Diagnosis not present

## 2022-06-03 DIAGNOSIS — D122 Benign neoplasm of ascending colon: Secondary | ICD-10-CM | POA: Diagnosis not present

## 2022-06-03 DIAGNOSIS — K5730 Diverticulosis of large intestine without perforation or abscess without bleeding: Secondary | ICD-10-CM | POA: Diagnosis not present

## 2022-06-03 DIAGNOSIS — Z1211 Encounter for screening for malignant neoplasm of colon: Secondary | ICD-10-CM | POA: Diagnosis not present

## 2022-06-03 DIAGNOSIS — K644 Residual hemorrhoidal skin tags: Secondary | ICD-10-CM | POA: Diagnosis not present

## 2022-06-03 DIAGNOSIS — D123 Benign neoplasm of transverse colon: Secondary | ICD-10-CM | POA: Diagnosis not present

## 2022-06-03 DIAGNOSIS — K648 Other hemorrhoids: Secondary | ICD-10-CM | POA: Diagnosis not present

## 2022-07-13 DIAGNOSIS — C50912 Malignant neoplasm of unspecified site of left female breast: Secondary | ICD-10-CM

## 2022-07-13 HISTORY — DX: Malignant neoplasm of unspecified site of left female breast: C50.912

## 2022-09-17 DIAGNOSIS — E0590 Thyrotoxicosis, unspecified without thyrotoxic crisis or storm: Secondary | ICD-10-CM | POA: Diagnosis not present

## 2022-09-18 DIAGNOSIS — E0500 Thyrotoxicosis with diffuse goiter without thyrotoxic crisis or storm: Secondary | ICD-10-CM | POA: Diagnosis not present

## 2022-09-18 DIAGNOSIS — E0590 Thyrotoxicosis, unspecified without thyrotoxic crisis or storm: Secondary | ICD-10-CM | POA: Diagnosis not present

## 2022-09-25 DIAGNOSIS — E0590 Thyrotoxicosis, unspecified without thyrotoxic crisis or storm: Secondary | ICD-10-CM | POA: Diagnosis not present

## 2022-10-16 DIAGNOSIS — E0500 Thyrotoxicosis with diffuse goiter without thyrotoxic crisis or storm: Secondary | ICD-10-CM | POA: Diagnosis not present

## 2022-10-16 DIAGNOSIS — R748 Abnormal levels of other serum enzymes: Secondary | ICD-10-CM | POA: Diagnosis not present

## 2022-10-16 DIAGNOSIS — E0590 Thyrotoxicosis, unspecified without thyrotoxic crisis or storm: Secondary | ICD-10-CM | POA: Diagnosis not present

## 2022-11-11 DIAGNOSIS — M810 Age-related osteoporosis without current pathological fracture: Secondary | ICD-10-CM | POA: Diagnosis not present

## 2022-11-11 DIAGNOSIS — E0500 Thyrotoxicosis with diffuse goiter without thyrotoxic crisis or storm: Secondary | ICD-10-CM | POA: Diagnosis not present

## 2022-11-11 DIAGNOSIS — Z23 Encounter for immunization: Secondary | ICD-10-CM | POA: Diagnosis not present

## 2022-11-11 DIAGNOSIS — R748 Abnormal levels of other serum enzymes: Secondary | ICD-10-CM | POA: Diagnosis not present

## 2022-11-11 DIAGNOSIS — E7800 Pure hypercholesterolemia, unspecified: Secondary | ICD-10-CM | POA: Diagnosis not present

## 2022-11-11 DIAGNOSIS — Z0000 Encounter for general adult medical examination without abnormal findings: Secondary | ICD-10-CM | POA: Diagnosis not present

## 2022-11-12 ENCOUNTER — Other Ambulatory Visit: Payer: Self-pay | Admitting: Internal Medicine

## 2022-11-12 DIAGNOSIS — Z0000 Encounter for general adult medical examination without abnormal findings: Secondary | ICD-10-CM

## 2022-11-16 DIAGNOSIS — E7800 Pure hypercholesterolemia, unspecified: Secondary | ICD-10-CM | POA: Diagnosis not present

## 2022-11-16 DIAGNOSIS — E0590 Thyrotoxicosis, unspecified without thyrotoxic crisis or storm: Secondary | ICD-10-CM | POA: Diagnosis not present

## 2022-11-16 DIAGNOSIS — M810 Age-related osteoporosis without current pathological fracture: Secondary | ICD-10-CM | POA: Diagnosis not present

## 2022-11-16 DIAGNOSIS — E0500 Thyrotoxicosis with diffuse goiter without thyrotoxic crisis or storm: Secondary | ICD-10-CM | POA: Diagnosis not present

## 2022-11-16 DIAGNOSIS — Z0000 Encounter for general adult medical examination without abnormal findings: Secondary | ICD-10-CM | POA: Diagnosis not present

## 2022-11-16 DIAGNOSIS — R748 Abnormal levels of other serum enzymes: Secondary | ICD-10-CM | POA: Diagnosis not present

## 2022-12-08 ENCOUNTER — Other Ambulatory Visit (HOSPITAL_COMMUNITY): Payer: Self-pay | Admitting: Internal Medicine

## 2022-12-08 DIAGNOSIS — R748 Abnormal levels of other serum enzymes: Secondary | ICD-10-CM

## 2022-12-10 ENCOUNTER — Ambulatory Visit (HOSPITAL_COMMUNITY)
Admission: RE | Admit: 2022-12-10 | Discharge: 2022-12-10 | Disposition: A | Discharge status: Home / Self Care | Payer: Medicare Other | Source: Ambulatory Visit | Attending: Internal Medicine | Admitting: Internal Medicine

## 2022-12-10 DIAGNOSIS — R748 Abnormal levels of other serum enzymes: Secondary | ICD-10-CM | POA: Diagnosis not present

## 2022-12-15 ENCOUNTER — Ambulatory Visit
Admission: RE | Admit: 2022-12-15 | Discharge: 2022-12-15 | Disposition: A | Discharge status: Home / Self Care | Payer: Medicare Other | Source: Ambulatory Visit | Attending: Internal Medicine | Admitting: Internal Medicine

## 2022-12-15 DIAGNOSIS — Z1231 Encounter for screening mammogram for malignant neoplasm of breast: Secondary | ICD-10-CM | POA: Diagnosis not present

## 2022-12-15 DIAGNOSIS — Z0000 Encounter for general adult medical examination without abnormal findings: Secondary | ICD-10-CM

## 2022-12-17 ENCOUNTER — Other Ambulatory Visit: Payer: Self-pay | Admitting: Internal Medicine

## 2022-12-17 DIAGNOSIS — R928 Other abnormal and inconclusive findings on diagnostic imaging of breast: Secondary | ICD-10-CM

## 2022-12-18 DIAGNOSIS — R748 Abnormal levels of other serum enzymes: Secondary | ICD-10-CM | POA: Diagnosis not present

## 2022-12-18 DIAGNOSIS — E0590 Thyrotoxicosis, unspecified without thyrotoxic crisis or storm: Secondary | ICD-10-CM | POA: Diagnosis not present

## 2022-12-21 ENCOUNTER — Ambulatory Visit
Admission: RE | Admit: 2022-12-21 | Discharge: 2022-12-21 | Disposition: A | Discharge status: Home / Self Care | Payer: Medicare Other | Source: Ambulatory Visit | Attending: Internal Medicine | Admitting: Internal Medicine

## 2022-12-21 ENCOUNTER — Other Ambulatory Visit: Payer: Self-pay | Admitting: Internal Medicine

## 2022-12-21 DIAGNOSIS — R928 Other abnormal and inconclusive findings on diagnostic imaging of breast: Secondary | ICD-10-CM

## 2022-12-21 DIAGNOSIS — R921 Mammographic calcification found on diagnostic imaging of breast: Secondary | ICD-10-CM | POA: Diagnosis not present

## 2022-12-21 DIAGNOSIS — N6320 Unspecified lump in the left breast, unspecified quadrant: Secondary | ICD-10-CM

## 2022-12-21 DIAGNOSIS — R922 Inconclusive mammogram: Secondary | ICD-10-CM | POA: Diagnosis not present

## 2022-12-24 ENCOUNTER — Ambulatory Visit
Admission: RE | Admit: 2022-12-24 | Discharge: 2022-12-24 | Disposition: A | Discharge status: Home / Self Care | Payer: Medicare Other | Source: Ambulatory Visit | Attending: Internal Medicine | Admitting: Internal Medicine

## 2022-12-24 ENCOUNTER — Other Ambulatory Visit: Payer: Self-pay | Admitting: Internal Medicine

## 2022-12-24 DIAGNOSIS — R921 Mammographic calcification found on diagnostic imaging of breast: Secondary | ICD-10-CM

## 2022-12-24 DIAGNOSIS — N6012 Diffuse cystic mastopathy of left breast: Secondary | ICD-10-CM | POA: Diagnosis not present

## 2022-12-24 DIAGNOSIS — D0512 Intraductal carcinoma in situ of left breast: Secondary | ICD-10-CM | POA: Diagnosis not present

## 2022-12-24 DIAGNOSIS — D242 Benign neoplasm of left breast: Secondary | ICD-10-CM | POA: Diagnosis not present

## 2022-12-24 DIAGNOSIS — R928 Other abnormal and inconclusive findings on diagnostic imaging of breast: Secondary | ICD-10-CM

## 2022-12-24 DIAGNOSIS — N6320 Unspecified lump in the left breast, unspecified quadrant: Secondary | ICD-10-CM

## 2022-12-24 HISTORY — PX: BREAST BIOPSY: SHX20

## 2023-01-04 DIAGNOSIS — H524 Presbyopia: Secondary | ICD-10-CM | POA: Diagnosis not present

## 2023-01-04 DIAGNOSIS — Z961 Presence of intraocular lens: Secondary | ICD-10-CM | POA: Diagnosis not present

## 2023-01-04 DIAGNOSIS — H40013 Open angle with borderline findings, low risk, bilateral: Secondary | ICD-10-CM | POA: Diagnosis not present

## 2023-01-19 DIAGNOSIS — E0590 Thyrotoxicosis, unspecified without thyrotoxic crisis or storm: Secondary | ICD-10-CM | POA: Diagnosis not present

## 2023-01-29 DIAGNOSIS — D0512 Intraductal carcinoma in situ of left breast: Secondary | ICD-10-CM | POA: Diagnosis not present

## 2023-02-03 ENCOUNTER — Other Ambulatory Visit: Payer: Self-pay | Admitting: General Surgery

## 2023-02-03 DIAGNOSIS — D0512 Intraductal carcinoma in situ of left breast: Secondary | ICD-10-CM

## 2023-02-09 ENCOUNTER — Encounter: Payer: Self-pay | Admitting: *Deleted

## 2023-02-09 ENCOUNTER — Other Ambulatory Visit: Payer: Self-pay | Admitting: *Deleted

## 2023-02-09 DIAGNOSIS — D0512 Intraductal carcinoma in situ of left breast: Secondary | ICD-10-CM | POA: Insufficient documentation

## 2023-02-10 ENCOUNTER — Telehealth: Payer: Self-pay | Admitting: Radiation Oncology

## 2023-02-10 NOTE — Telephone Encounter (Signed)
Called patient to schedule a consultation w. Dr. Moody. No answer, LVM for a return call.  

## 2023-02-22 DIAGNOSIS — E0590 Thyrotoxicosis, unspecified without thyrotoxic crisis or storm: Secondary | ICD-10-CM | POA: Diagnosis not present

## 2023-02-22 NOTE — Pre-Procedure Instructions (Signed)
Surgical Instructions   Your procedure is scheduled on Tuesday, August 20th. Report to [facility] Main Entrance "A" at 05:30 A.M., then check in with the Admitting office. Any questions or running late day of surgery: call [phone]  Questions prior to your surgery date: call [phone], Monday-Friday, 8am-4pm. If you experience any cold or flu symptoms such as cough, fever, chills, shortness of breath, etc. between now and your scheduled surgery, please notify us at the above number.     Remember:  Do not eat after midnight the night before your surgery   You may drink clear liquids until 04:30 AM the morning of your surgery.   Clear liquids allowed are: Water, Non-Citrus Juices (without pulp), Carbonated Beverages, Clear Tea, Black Coffee Only (NO MILK, CREAM OR POWDERED CREAMER of any kind), and Gatorade.  Patient Instructions  The night before surgery:  No food after midnight. ONLY clear liquids after midnight  The day of surgery (if you do NOT have diabetes):  Drink ONE (1) Pre-Surgery Clear Ensure by 04:30 AM the morning of surgery. Drink in one sitting. Do not sip.  This drink was given to you during your hospital  pre-op appointment visit.  Nothing else to drink after completing the  Pre-Surgery Clear Ensure.          If you have questions, please contact your surgeon's office.    Take these medicines the morning of surgery with A SIP OF WATER  atenolol (TENORMIN)  methimazole (TAPAZOLE)     One week prior to surgery, STOP taking any Aspirin (unless otherwise instructed by your surgeon) Aleve, Naproxen, Ibuprofen, Motrin, Advil, Goody's, BC's, all herbal medications, fish oil, and non-prescription vitamins.                     Do NOT Smoke (Tobacco/Vaping) for 24 hours prior to your procedure.  If you use a CPAP at night, you may bring your mask/headgear for your overnight stay.   You will be asked to remove any contacts, glasses, piercing's, hearing aid's,  dentures/partials prior to surgery. Please bring cases for these items if needed.    Patients discharged the day of surgery will not be allowed to drive home, and someone needs to stay with them for 24 hours.  SURGICAL WAITING ROOM VISITATION Patients may have no more than 2 support people in the waiting area - these visitors may rotate.   Pre-op nurse will coordinate an appropriate time for 1 ADULT support person, who may not rotate, to accompany patient in pre-op.  Children under the age of 65 must have an adult with them who is not the patient and must remain in the main waiting area with an adult.  If the patient needs to stay at the hospital during part of their recovery, the visitor guidelines for inpatient rooms apply.  Please refer to the [facility] website for the visitor guidelines for any additional information.   If you received a COVID test during your pre-op visit  it is requested that you wear a mask when out in public, stay away from anyone that may not be feeling well and notify your surgeon if you develop symptoms. If you have been in contact with anyone that has tested positive in the last 10 days please notify you surgeon.      Pre-operative CHG Bathing Instructions   You can play a key role in reducing the risk of infection after surgery. Your skin needs to be as free of  germs as possible. You can reduce the number of germs on your skin by washing with CHG (chlorhexidine gluconate) soap before surgery. CHG is an antiseptic soap that kills germs and continues to kill germs even after washing.   DO NOT use if you have an allergy to chlorhexidine/CHG or antibacterial soaps. If your skin becomes reddened or irritated, stop using the CHG and notify one of our RNs at [phone].              TAKE A SHOWER THE NIGHT BEFORE SURGERY AND THE DAY OF SURGERY    Please keep in mind the following:  DO NOT shave, including legs and underarms, 48 hours prior to surgery.   You may  shave your face before/day of surgery.  Place clean sheets on your bed the night before surgery Use a clean washcloth (not used since being washed) for each shower. DO NOT sleep with pet's night before surgery.  CHG Shower Instructions:  If you choose to wash your hair and private area, wash first with your normal shampoo/soap.  After you use shampoo/soap, rinse your hair and body thoroughly to remove shampoo/soap residue.  Turn the water OFF and apply half the bottle of CHG soap to a CLEAN washcloth.  Apply CHG soap ONLY FROM YOUR NECK DOWN TO YOUR TOES (washing for 3-5 minutes)  DO NOT use CHG soap on face, private areas, open wounds, or sores.  Pay special attention to the area where your surgery is being performed.  If you are having back surgery, having someone wash your back for you may be helpful. Wait 2 minutes after CHG soap is applied, then you may rinse off the CHG soap.  Pat dry with a clean towel  Put on clean pajamas    Additional instructions for the day of surgery: DO NOT APPLY any lotions, deodorants, cologne, or perfumes.   Do not wear jewelry or makeup Do not wear nail polish, gel polish, artificial nails, or any other type of covering on natural nails (fingers and toes) Do not bring valuables to the hospital. [facility] is not responsible for valuables/personal belongings. Put on clean/comfortable clothes.  Please brush your teeth.  Ask your nurse before applying any prescription medications to the skin.

## 2023-02-23 ENCOUNTER — Encounter (HOSPITAL_COMMUNITY): Payer: Self-pay

## 2023-02-23 ENCOUNTER — Other Ambulatory Visit: Payer: Self-pay

## 2023-02-23 ENCOUNTER — Encounter (HOSPITAL_COMMUNITY)
Admission: RE | Admit: 2023-02-23 | Discharge: 2023-02-23 | Disposition: A | Discharge status: Home / Self Care | Payer: Medicare Other | Source: Ambulatory Visit | Attending: General Surgery | Admitting: General Surgery

## 2023-02-23 DIAGNOSIS — Z01818 Encounter for other preprocedural examination: Secondary | ICD-10-CM | POA: Insufficient documentation

## 2023-02-23 DIAGNOSIS — Z01812 Encounter for preprocedural laboratory examination: Secondary | ICD-10-CM | POA: Diagnosis present

## 2023-02-23 HISTORY — DX: Unspecified osteoarthritis, unspecified site: M19.90

## 2023-02-23 NOTE — Progress Notes (Addendum)
PCP - Dr. Hillard Danker Cardiologist - denies Endocrinologist- Dr. Talmage Coin  PPM/ICD - denies   Chest x-ray - 05/14/00 EKG - denies Stress Test - denies ECHO - denies Cardiac Cath - denies  Sleep Study - denies CPAP -   DM- denies  ASA/Blood Thinner Instructions: n/a   ERAS Protcol - yes PRE-SURGERY Ensure given at PAT  COVID TEST- n/a  Pt states she takes Atenolol with Methimazole in combination for Graves Disease.  Anesthesia review: yes, pt has appt with endocrinologist (Dr. Talmage Coin) tomorrow 8/14 to "see if Graves disease has come out of remission." Pt instructed to call if there are any questions or medication changes  Patient denies shortness of breath, fever, cough and chest pain at PAT appointment   All instructions explained to the patient, with a verbal understanding of the material. Patient agrees to go over the instructions while at home for a better understanding.  The opportunity to ask questions was provided.

## 2023-02-24 ENCOUNTER — Inpatient Hospital Stay (HOSPITAL_BASED_OUTPATIENT_CLINIC_OR_DEPARTMENT_OTHER): Payer: Medicare Other | Admitting: Genetic Counselor

## 2023-02-24 ENCOUNTER — Inpatient Hospital Stay: Payer: Medicare Other | Attending: Hematology and Oncology | Admitting: Hematology and Oncology

## 2023-02-24 ENCOUNTER — Encounter: Payer: Self-pay | Admitting: Genetic Counselor

## 2023-02-24 ENCOUNTER — Other Ambulatory Visit: Payer: Self-pay | Admitting: Genetic Counselor

## 2023-02-24 ENCOUNTER — Inpatient Hospital Stay: Payer: Medicare Other

## 2023-02-24 VITALS — BP 164/69 | HR 77 | Temp 97.9°F | Resp 18 | Ht 61.0 in | Wt 163.9 lb

## 2023-02-24 DIAGNOSIS — D0512 Intraductal carcinoma in situ of left breast: Secondary | ICD-10-CM

## 2023-02-24 DIAGNOSIS — E0590 Thyrotoxicosis, unspecified without thyrotoxic crisis or storm: Secondary | ICD-10-CM | POA: Diagnosis not present

## 2023-02-24 DIAGNOSIS — Z803 Family history of malignant neoplasm of breast: Secondary | ICD-10-CM | POA: Diagnosis not present

## 2023-02-24 DIAGNOSIS — E0500 Thyrotoxicosis with diffuse goiter without thyrotoxic crisis or storm: Secondary | ICD-10-CM | POA: Diagnosis not present

## 2023-02-24 NOTE — Assessment & Plan Note (Signed)
12/24/2022: Screening mammogram detected left breast calcifications subareolar, 2 adjacent groups spanning 5 mm and 8 mm together 1.4 cm, ultrasound measured 4 mm.  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 It is considered a precancerous lesion. DCIS is classified as a 0. It is generally detected through mammograms as calcifications. We discussed the significance of grades and its impact on prognosis. We also discussed the importance of ER and PR receptors and their implications to adjuvant treatment options. Prognosis of DCIS dependence on grade, comedo necrosis. It is anticipated that if not treated, 20-30% of DCIS can develop into invasive breast cancer.  Recommendation: 1. Breast conserving surgery 2. Followed by adjuvant radiation therapy 3. Followed by antiestrogen therapy with tamoxifen 5 years  Tamoxifen counseling: We discussed the risks and benefits of tamoxifen. These include but not limited to insomnia, hot flashes, mood changes, vaginal dryness, and weight gain. Although rare, serious side effects including endometrial cancer, risk of blood clots were also discussed. We strongly believe that the benefits far outweigh the risks. Patient understands these risks and consented to starting treatment. Planned treatment duration is 5 years.  Return to clinic after surgery to discuss the final pathology report and come up with an adjuvant treatment plan.

## 2023-02-24 NOTE — Progress Notes (Signed)
[facility] CONSULT NOTE  Patient Care Team: Thana Ates, MD as PCP - General (Internal Medicine) Janalyn Harder, MD (Inactive) as Consulting Physician (Dermatology)  CHIEF COMPLAINTS/PURPOSE OF CONSULTATION:  Newly diagnosed left breast cancer  HISTORY OF PRESENTING ILLNESS:  Renee Smith 77 y.o. female is here because of recent diagnosis of left breast cancer.  Patient routine screening mammogram detected left breast calcifications spanning 1.4 cm.  Tactic biopsies revealed intraductal papilloma with UDH in one of the biopsy and the second biopsy came back intermediate grade DCIS with UDH and apocrine metaplasia.  Patient was seen by Dr. Dwain Sarna and plan is to perform lumpectomy.  She is here today to discuss adjuvant treatment options.  I reviewed her records extensively and collaborated the history with the patient.  SUMMARY OF ONCOLOGIC HISTORY: Oncology History  Ductal carcinoma in situ (DCIS) of left breast  12/24/2022 Initial Diagnosis   Screening mammogram detected left breast calcifications subareolar, 2 adjacent groups spanning 5 mm and 8 mm together 1.4 cm, ultrasound measured 4 mm. 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       MEDICAL HISTORY:  Past Medical History:  Diagnosis Date   Arthritis    Atypical mole 02/15/1996   slight-mod-left inner ankle    Atypical mole 03/11/2011   mildd-right hip   Breast cancer, left breast (HCC) 2024   Family history of breast cancer    Graves disease    in remission    SURGICAL HISTORY: Past Surgical History:  Procedure Laterality Date   BREAST BIOPSY Right 08/17/1997   BREAST BIOPSY Left 12/24/2022   MM LT BREAST BX W LOC DEV 1ST LESION IMAGE BX SPEC STEREO GUIDE 12/24/2022 [facility]   BREAST BIOPSY Left 12/24/2022   MM LT BREAST BX W LOC DEV EA AD LESION IMG BX SPEC  STEREO GUIDE 12/24/2022 [facility]   BREAST EXCISIONAL BIOPSY Right 1990   CATARACT EXTRACTION Bilateral    CESAREAN SECTION     x2 (1975, 1978)   RETINAL DETACHMENT SURGERY     SCLERAL BUCKLE Bilateral    TUBAL LIGATION      SOCIAL HISTORY: Social History   Socioeconomic History   Marital status: Married    Spouse name: Not on file   Number of children: 3   Years of education: Not on file   Highest education level: Not on file  Occupational History   Not on file  Tobacco Use   Smoking status: Never   Smokeless tobacco: Never  Vaping Use   Vaping status: Never Used  Substance and Sexual Activity   Alcohol use: Yes    Alcohol/week: 12.0 - 14.0 standard drinks of alcohol    Types: 12 - 14 Glasses of wine per week    Comment: 1.5-2 glasses of wine/day   Drug use: Never   Sexual activity: Not on file  Other Topics Concern   Not on file  Social History Narrative   Not on file   Social Determinants of Health   Financial Resource Strain: Not on file  Food Insecurity: Not on file  Transportation Needs: Not on file  Physical Activity: Not on file  Stress: Not on file  Social Connections: Not on file  Intimate Partner Violence: Not on file    FAMILY HISTORY: Family History  Problem Relation Age of Onset   Breast cancer Sister  dx. early 30s, d. 48   Heart disease Maternal Grandmother    Heart attack Maternal Grandfather    Breast cancer Cousin        pat first cousin    ALLERGIES:  has No Known Allergies.  MEDICATIONS:  Current Outpatient Medications  Medication Sig Dispense Refill   atenolol (TENORMIN) 25 MG tablet Take 25 mg by mouth in the morning.     CALCIUM PO Take 1 tablet by mouth in the morning.     Cholecalciferol (VITAMIN D-3 PO) Take 2 tablets by mouth in the morning.     ibuprofen (ADVIL) 200 MG tablet Take 400 mg by mouth every 8 (eight) hours as needed (pain/headache.).     methimazole (TAPAZOLE) 5 MG tablet Take 5 mg by mouth in  the morning.     No current facility-administered medications for this visit.    REVIEW OF SYSTEMS:   Constitutional: Denies fevers, chills or abnormal night sweats Breast:  Denies any palpable lumps or discharge All other systems were reviewed with the patient and are negative.  PHYSICAL EXAMINATION: ECOG PERFORMANCE STATUS: 0 - Asymptomatic  Vitals:   02/24/23 1130  BP: (!) 164/69  Pulse: 77  Resp: 18  Temp: 97.9 F (36.6 C)  SpO2: 97%   Filed Weights   02/24/23 1130  Weight: 163 lb 14.4 oz (74.3 kg)    GENERAL:alert, no distress and comfortable     RADIOGRAPHIC STUDIES: I have personally reviewed the radiological reports and agreed with the findings in the report.  ASSESSMENT AND PLAN:  Ductal carcinoma in situ (DCIS) of left breast 12/24/2022: Screening mammogram detected left breast calcifications subareolar, 2 adjacent groups spanning 5 mm and 8 mm together 1.4 cm, ultrasound measured 4 mm.  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 It is considered a precancerous lesion. DCIS is classified as a 0. It is generally detected through mammograms as calcifications. We discussed the significance of grades and its impact on prognosis. We also discussed the importance of ER and PR receptors and their implications to adjuvant treatment options. Prognosis of DCIS dependence on grade, comedo necrosis. It is anticipated that if not treated, 20-30% of DCIS can develop into invasive breast cancer.  Recommendation: 1. Breast conserving surgery 2. Followed by adjuvant radiation therapy 3. Followed by antiestrogen therapy with tamoxifen 5 years Genetic counseling today  Tamoxifen counseling: We discussed the risks and benefits of tamoxifen. These include but  not limited to insomnia, hot flashes, mood changes, vaginal dryness, and weight gain. Although rare, serious side effects including endometrial cancer, risk of blood clots were also discussed. We strongly believe that the benefits far outweigh the risks. Patient understands these risks and consented to starting treatment. Planned treatment duration is 5 years.  She used to serve as a Engineer, maintenance and works with [employer]  Return to clinic after surgery to discuss the final pathology report and come up with an adjuvant treatment plan.   All questions were answered. The patient knows to call the clinic with any problems, questions or concerns.    Tamsen Meek, MD 02/24/23

## 2023-02-24 NOTE — Progress Notes (Signed)
REFERRING PROVIDER: Thana Ates, MD [address] [place],  [place] [address]  PRIMARY PROVIDER:  Thana Ates, MD  PRIMARY REASON FOR VISIT:  1. Family history of breast cancer   2. Ductal carcinoma in situ (DCIS) of left breast      HISTORY OF PRESENT ILLNESS:   Renee Smith, a 77 y.o. female, was seen for a [place] cancer genetics consultation at the request of Dr. Margaretann Loveless due to a personal and family history of breast cancer.  Renee Smith presents to clinic today to discuss the possibility of a hereditary predisposition to cancer, genetic testing, and to further clarify her future cancer risks, as well as potential cancer risks for family members.   In August 2024, at the age of 70, Renee Smith was diagnosed with DCIS of the left breast. The treatment plan includes lumpectomy and radiation. She reports having genetic testing 20 years ago that she thinks was negative.   CANCER HISTORY:  Oncology History   No history exists.     RISK FACTORS:  Menarche was at age ??.  First live birth at age 38.  OCP use for approximately  5-10  years.  Ovaries intact: yes.  Hysterectomy: no.  Menopausal status: postmenopausal.  HRT use:  2-3  years. Colonoscopy: no; normal. Mammogram within the last year: yes. Number of breast biopsies: 3. Up to date with pelvic exams: yes. Any excessive radiation exposure in the past: no  Past Medical History:  Diagnosis Date   Arthritis    Atypical mole 02/15/1996   slight-mod-left inner ankle    Atypical mole 03/11/2011   mildd-right hip   Breast cancer, left breast (HCC) 2024   Family history of breast cancer    Graves disease    in remission    Past Surgical History:  Procedure Laterality Date   BREAST BIOPSY Right 08/17/1997   BREAST BIOPSY Left 12/24/2022   MM LT BREAST BX W LOC DEV 1ST LESION IMAGE BX SPEC STEREO GUIDE 12/24/2022 [facility]   BREAST BIOPSY Left 12/24/2022   MM LT BREAST BX W LOC DEV EA AD LESION  IMG BX SPEC STEREO GUIDE 12/24/2022 [facility]   BREAST EXCISIONAL BIOPSY Right 1990   CATARACT EXTRACTION Bilateral    CESAREAN SECTION     x2 (1975, 1978)   RETINAL DETACHMENT SURGERY     SCLERAL BUCKLE Bilateral    TUBAL LIGATION      Social History   Socioeconomic History   Marital status: Married    Spouse name: Not on file   Number of children: 3   Years of education: Not on file   Highest education level: Not on file  Occupational History   Not on file  Tobacco Use   Smoking status: Never   Smokeless tobacco: Never  Vaping Use   Vaping status: Never Used  Substance and Sexual Activity   Alcohol use: Yes    Alcohol/week: 12.0 - 14.0 standard drinks of alcohol    Types: 12 - 14 Glasses of wine per week    Comment: 1.5-2 glasses of wine/day   Drug use: Never   Sexual activity: Not on file  Other Topics Concern   Not on file  Social History Narrative   Not on file   Social Determinants of Health   Financial Resource Strain: Not on file  Food Insecurity: Not on file  Transportation Needs: Not on file  Physical Activity: Not on file  Stress: Not on  file  Social Connections: Not on file     FAMILY HISTORY:  We obtained a detailed, 4-generation family history.  Significant diagnoses are listed below: Family History  Problem Relation Age of Onset   Breast cancer Sister        dx. early 4s, d. 13   Heart disease Maternal Grandmother    Heart attack Maternal Grandfather    Breast cancer Cousin        pat first cousin     The patient has three sons who are cancer free. She has a brother and sister.  The sister developed breast cancer in her early 74's and died at 33.  Both parents are deceased.   The patient's mother died in her 11's.  She had several siblings who did not have cancer. There is no other reported family history of cancer.  The patient's father died at 56.  He had multiple siblings who were cancer free.  One sister had a daughter with  breast cancer.  Renee Smith is aware of previous family history of genetic testing for hereditary cancer risks. Patient's maternal ancestors are of [place] descent, and paternal ancestors are of [place] descent. There is reported Ashkenazi Jewish ancestry. There is no known consanguinity.  GENETIC COUNSELING ASSESSMENT: [name] is a 77 y.o. female with a personal and family history of breast cancer which is somewhat suggestive of a hereditary cancer syndrome and predisposition to cancer given the young age of onset and jewish ancestry. We, therefore, discussed and recommended the following at today's visit.   DISCUSSION: We discussed that, in general, most cancer is not inherited in families, but instead is sporadic or familial. Sporadic cancers occur by chance and typically happen at older ages (>50 years) as this type of cancer is caused by genetic changes acquired during an individual's lifetime. Some families have more cancers than would be expected by chance; however, the ages or types of cancer are not consistent with a known genetic mutation or known genetic mutations have been ruled out. This type of familial cancer is thought to be due to a combination of multiple genetic, environmental, hormonal, and lifestyle factors. While this combination of factors likely increases the risk of cancer, the exact source of this risk is not currently identifiable or testable.  We discussed that 5 - 10% of breast cancer is hereditary, with most cases associated with BRCA mutations.  There are other genes that can be associated with hereditary breast cancer syndromes.  These include ATM, CHEK2 and PALB2.  We discussed that testing is beneficial for several reasons including knowing how to follow individuals after completing their treatment, identifying whether potential treatment options such as PARP inhibitors would be beneficial, and understand if other family members could be at risk for cancer and allow them to  undergo genetic testing.   We reviewed the characteristics, features and inheritance patterns of hereditary cancer syndromes. We also discussed genetic testing, including the appropriate family members to test, the process of testing, insurance coverage and turn-around-time for results. We discussed the implications of a negative, positive, carrier and/or variant of uncertain significant result. Ms. Macintosh  was offered a common hereditary cancer panel (47 genes) and an expanded pan-cancer panel (77 genes). Ms. Scheibner was informed of the benefits and limitations of each panel, including that expanded pan-cancer panels contain genes that do not have clear management guidelines at this point in time.  We also discussed that as the number of genes included on a panel  increases, the chances of variants of uncertain significance increases. Ms. Struve decided to pursue genetic testing for the CancerNext-Expanded+RNAinsight gene panel.   The CancerNext-Expanded gene panel offered by [facility] and includes sequencing and rearrangement analysis for the following 77 genes: AIP, ALK, APC*, ATM*, AXIN2, BAP1, BARD1, BMPR1A, BRCA1*, BRCA2*, BRIP1*, CDC73, CDH1*, CDK4, CDKN1B, CDKN2A, CHEK2*, CTNNA1, DICER1, FH, FLCN, KIF1B, LZTR1, MAX, MEN1, MET, MLH1*, MSH2*, MSH3, MSH6*, MUTYH*, NF1*, NF2, NTHL1, PALB2*, PHOX2B, PMS2*, POT1, PRKAR1A, PTCH1, PTEN*, RAD51C*, RAD51D*, RB1, RET, SDHA, SDHAF2, SDHB, SDHC, SDHD, SMAD4, SMARCA4, SMARCB1, SMARCE1, STK11, SUFU, TMEM127, TP53*, TSC1, TSC2, and VHL (sequencing and deletion/duplication); EGFR, EGLN1, HOXB13, KIT, MITF, PDGFRA, POLD1, and POLE (sequencing only); EPCAM and GREM1 (deletion/duplication only). DNA and RNA analyses performed for * genes.   Based on Ms. Slatten's personal and family history of cancer, she meets medical criteria for genetic testing. Despite that she meets criteria, she may still have an out of pocket cost. We discussed that if her out of pocket cost for  testing is over $100, the laboratory will call and confirm whether she wants to proceed with testing.  If the out of pocket cost of testing is less than $100 she will be billed by the genetic testing laboratory.   We discussed that some people do not want to undergo genetic testing due to fear of genetic discrimination.  The Genetic Information Nondiscrimination Act (GINA) was signed into federal law in 2008. GINA prohibits health insurers and most employers from discriminating against individuals based on genetic information (including the results of genetic tests and family history information). According to GINA, health insurance companies cannot consider genetic information to be a preexisting condition, nor can they use it to make decisions regarding coverage or rates. GINA also makes it illegal for most employers to use genetic information in making decisions about hiring, firing, promotion, or terms of employment. It is important to note that GINA does not offer protections for life insurance, disability insurance, or long-term care insurance. GINA does not apply to those in the [employer], those who work for companies with less than 15 employees, and new life insurance or long-term disability insurance policies.  Health status due to a cancer diagnosis is not protected under GINA. More information about GINA can be found by visiting [email or web address].   PLAN: After considering the risks, benefits, and limitations, Ms. Mclauglin provided informed consent to pursue genetic testing and the blood sample was sent to [facility] for analysis of the CancerNext-Expanded+RNAinsight. Results should be available within approximately 2-3 weeks' time, at which point they will be disclosed by telephone to Ms. Truglio, as will any additional recommendations warranted by these results. Ms. Hickox will receive a summary of her genetic counseling visit and a copy of her results once available. This information  will also be available in Epic.   Lastly, we encouraged Ms. Filippelli to remain in contact with cancer genetics annually so that we can continuously update the family history and inform her of any changes in cancer genetics and testing that may be of benefit for this family.   Ms. Schoendorf questions were answered to her satisfaction today. Our contact information was provided should additional questions or concerns arise. Thank you for the referral and allowing [place] to share in the care of your patient.    P. Lowell Guitar, MS, [facility] Licensed, Patent attorney [email or web address] phone: [phone]  The patient was seen for a total of 30 minutes in face-to-face genetic counseling.  The patient was  seen alone.  Drs. Meliton Rattan, and/or [address] were available for questions, if needed..    _______________________________________________________________________ For Office Staff:  Number of people involved in session: 1 Was an Intern/ student involved with case: no

## 2023-02-25 ENCOUNTER — Telehealth: Payer: Self-pay | Admitting: Hematology and Oncology

## 2023-02-25 NOTE — Telephone Encounter (Signed)
Scheduled appointment per 8/14 los. Patient is aware of the made appointment.

## 2023-02-25 NOTE — Progress Notes (Signed)
New Breast Cancer Diagnosis: Left Breast  Did patient present with symptoms (if so, please note symptoms) or screening mammography?: Routine mammogram detected left breast calcifications spanning 1.4 cm.   Location and Extent of disease :left breast, measured 0.3 cm in greatest dimension. Adenopathy no.  Histology per Pathology Report: grade 2, DCIS 12/24/2022  Receptor Status: ER(positive), PR (positive), Her2-neu (), Ki-(%)   Surgeon and surgical plan, if any:  Dr. Dwain Sarna -Left breast seed guided lumpectomy 03/02/2023   Medical oncologist, treatment if any:   Dr. Pamelia Hoit 02/24/2023 -Recommendation: 1. Breast conserving surgery 2. Followed by adjuvant radiation therapy 3. Followed by antiestrogen therapy with tamoxifen 5 years Genetic counseling today   Family History of Breast/Ovarian/Prostate Cancer: Sister and paternal cousin had breast cancer.  Lymphedema issues, if any: None now.     Pain issues, if any: Denies.    SAFETY ISSUES: Prior radiation? No Pacemaker/ICD? No Possible current pregnancy? Postmenopausal Is the patient on methotrexate? No  Current Complaints / other details:   Genetics 02/24/2023-

## 2023-02-26 ENCOUNTER — Other Ambulatory Visit: Payer: Self-pay

## 2023-02-26 ENCOUNTER — Ambulatory Visit
Admission: RE | Admit: 2023-02-26 | Discharge: 2023-02-26 | Disposition: A | Discharge status: Home / Self Care | Payer: Medicare Other | Source: Ambulatory Visit | Attending: Radiation Oncology | Admitting: Radiation Oncology

## 2023-02-26 ENCOUNTER — Encounter: Payer: Self-pay | Admitting: Radiation Oncology

## 2023-02-26 ENCOUNTER — Inpatient Hospital Stay: Payer: Medicare Other

## 2023-02-26 VITALS — BP 137/74 | HR 67 | Temp 98.0°F | Resp 19 | Ht 61.0 in | Wt 163.0 lb

## 2023-02-26 DIAGNOSIS — E0500 Thyrotoxicosis with diffuse goiter without thyrotoxic crisis or storm: Secondary | ICD-10-CM | POA: Insufficient documentation

## 2023-02-26 DIAGNOSIS — D0512 Intraductal carcinoma in situ of left breast: Secondary | ICD-10-CM | POA: Diagnosis not present

## 2023-02-26 DIAGNOSIS — Z803 Family history of malignant neoplasm of breast: Secondary | ICD-10-CM | POA: Diagnosis not present

## 2023-02-26 DIAGNOSIS — M129 Arthropathy, unspecified: Secondary | ICD-10-CM | POA: Diagnosis not present

## 2023-02-26 DIAGNOSIS — R609 Edema, unspecified: Secondary | ICD-10-CM | POA: Diagnosis not present

## 2023-02-26 DIAGNOSIS — Z170 Estrogen receptor positive status [ER+]: Secondary | ICD-10-CM | POA: Insufficient documentation

## 2023-02-26 DIAGNOSIS — Z923 Personal history of irradiation: Secondary | ICD-10-CM | POA: Diagnosis not present

## 2023-02-26 DIAGNOSIS — Z79899 Other long term (current) drug therapy: Secondary | ICD-10-CM | POA: Insufficient documentation

## 2023-02-26 LAB — GENETIC SCREENING ORDER

## 2023-02-26 NOTE — Progress Notes (Signed)
Radiation Oncology         [phone] ________________________________  Name: Renee Smith        MRN: [id number]  Date of Service: 02/26/2023 DOB: 1946-05-15  NW:GNFA, Renee Singh, MD  Renee Loron, MD     REFERRING PHYSICIAN: Emelia Loron, MD   DIAGNOSIS: The encounter diagnosis was Ductal carcinoma in situ (DCIS) of left breast.   HISTORY OF PRESENT ILLNESS: Renee Smith is a 77 y.o. female seen at the request of Renee Smith for newly diagnosed left breast cancer. She presented with an asymmetry detected on screening mammogram. She proceeded with diagnostic mammogram and ultrasound that showed calcifications spanning 1.4 cm. Patient proceeded with biopsies with one revealing ductal carcinoma in situ that was ER and PR positive with the other biopsy showing an intraductal papilloma with UDH.  She was seen by Renee Smith on 01/29/23. After discussion of mastectomy versus lumpectomy with adjuvant therapy, the patient decided to proceed with a lumpectomy. Her surgery is scheduled for 03/02/23. She met with Dr. Pamelia Smith on 02/24/23 to discuss anti-hormone therapy. He recommended adjuvant tamoxifen therapy for 5 years.   She was kindly referred to [place] today to discuss adjuvant radiation therapy.    PREVIOUS RADIATION THERAPY: No   PAST MEDICAL HISTORY:  Past Medical History:  Diagnosis Date   Arthritis    Atypical mole 02/15/1996   slight-mod-left inner ankle    Atypical mole 03/11/2011   mildd-right hip   Breast cancer, left breast (HCC) 2024   Family history of breast cancer    Graves disease    in remission       PAST SURGICAL HISTORY: Past Surgical History:  Procedure Laterality Date   BREAST BIOPSY Right 08/17/1997   BREAST BIOPSY Left 12/24/2022   MM LT BREAST BX W LOC DEV 1ST LESION IMAGE BX SPEC STEREO GUIDE 12/24/2022 [facility]   BREAST BIOPSY Left 12/24/2022   MM LT BREAST BX W LOC DEV EA AD LESION IMG BX SPEC STEREO GUIDE 12/24/2022 [facility]  MAM[facility]   BREAST EXCISIONAL BIOPSY Right 1990   CATARACT EXTRACTION Bilateral    CESAREAN SECTION     x2 (1975, 1978)   RETINAL DETACHMENT SURGERY     SCLERAL BUCKLE Bilateral    TUBAL LIGATION       FAMILY HISTORY:  Family History  Problem Relation Age of Onset   Breast cancer Sister        dx. early 33s, d. 30   Heart disease Maternal Grandmother    Heart attack Maternal Grandfather    Breast cancer Cousin        pat first cousin     SOCIAL HISTORY:  reports that she has never smoked. She has never used smokeless tobacco. She reports current alcohol use of about 12.0 - 14.0 standard drinks of alcohol per week. She reports that she does not use drugs. She plays enjoys playing bridge and working out.    ALLERGIES: Patient has no known allergies.   MEDICATIONS:  Current Outpatient Medications  Medication Sig Dispense Refill   atenolol (TENORMIN) 25 MG tablet Take 25 mg by mouth in the morning.     ibuprofen (ADVIL) 200 MG tablet Take 400 mg by mouth every 8 (eight) hours as needed (pain/headache.).     methimazole (TAPAZOLE) 5 MG tablet Take 10 mg by mouth in the morning. Dose increased for upcoming surgery.     CALCIUM PO Take 1 tablet by mouth in the morning. (Patient not  taking: Reported on 02/26/2023)     Cholecalciferol (VITAMIN D-3 PO) Take 2 tablets by mouth in the morning. (Patient not taking: Reported on 02/26/2023)     No current facility-administered medications for this encounter.     REVIEW OF SYSTEMS: On review of systems, the patient reports that she is doing well overall. She denies any breast specific complaints.     PHYSICAL EXAM:  Wt Readings from Last 3 Encounters:  02/26/23 163 lb (73.9 kg)  02/24/23 163 lb 14.4 oz (74.3 kg)  02/23/23 163 lb 1.6 oz (74 kg)   Temp Readings from Last 3 Encounters:  02/26/23 98 F (36.7 C) (Oral)  02/24/23 97.9 F (36.6 C) (Temporal)  02/23/23 98.3 F (36.8 C)   BP Readings from Last 3 Encounters:   02/26/23 137/74  02/24/23 (!) 164/69  02/23/23 (!) 156/64   Pulse Readings from Last 3 Encounters:  02/26/23 67  02/24/23 77   Pain Assessment Pain Score: 0-No pain/10  In general this is a well appearing female in no acute distress. She's alert and oriented x4 and appropriate throughout the examination. Cardiopulmonary assessment is negative for acute distress and she exhibits normal effort.     ECOG = 0  0 - Asymptomatic (Fully active, able to carry on all predisease activities without restriction)  1 - Symptomatic but completely ambulatory (Restricted in physically strenuous activity but ambulatory and able to carry out work of a light or sedentary nature. For example, light housework, office work)  2 - Symptomatic, <50% in bed during the day (Ambulatory and capable of all self care but unable to carry out any work activities. Up and about more than 50% of waking hours)  3 - Symptomatic, >50% in bed, but not bedbound (Capable of only limited self-care, confined to bed or chair 50% or more of waking hours)  4 - Bedbound (Completely disabled. Cannot carry on any self-care. Totally confined to bed or chair)  5 - Death   Renee Smith MM, Renee Smith, Renee Smith, et al. [phone]). "Toxicity and response criteria of the [facility]". Am. Evlyn Clines. Oncol. 5 (6): 649-55    LABORATORY DATA:  No results found for: "WBC", "HGB", "HCT", "MCV", "PLT" No results found for: "NA", "K", "CL", "CO2" No results found for: "ALT", "AST", "GGT", "ALKPHOS", "BILITOT"    RADIOGRAPHY: No results found.     IMPRESSION/PLAN: 1. 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 One option would be antiestrogen therapy as discussed with Dr. Pamelia Smith. She would take a pill for approximately 5 years. Alternatively, she could receive adjuvant  radiation to decrease her risk of locoregional disease recurrence. The more aggressive option would be to pursue both modalities.   We discussed the fact that radiotherapy only provides a local control benefit while anti-estrogen pills provide systemic coverage. That being said, the risk of systemic failure is relatively low with her type of breast cancer.   We discussed the risks benefits and side effects of radiotherapy. She understands that the side effects would likely include some skin irritation and fatigue during the weeks of radiation. There is a risk of late effects which include, but are not necessarily limited to, cosmetic changes and rare lung toxicity. Dr. Mitzi Hansen would anticipate delivering approximately 4 weeks of radiotherapy    After a thorough discussion of standard hypofractionation over 4 weeks,  we discussed ultra hypofractionated radiation therapy (radiation once weekly for 5 weeks). We discussed that this approach is a bit less standard than longer regimens.  This method has shown a slight increase in normal tissue side effects over the 4 week hypofractionation standard at 5 years of follow-up. This includes effects like induration, and edema.  However, for elderly patients that are keen on the convenience of minimizing the number of procedures/visits to the cancer center, this is a nevertheless a reasonable approach to consider and the vast majority of patients do very well.    After our discussion today, she is inclined to choose radiation alone for adjuvant therapy. She is most interested in proceeding with the ultra-hypofractionated option as it will work with her scheduled better. The patient is scheduled to meet with Dr. Pamelia Smith on 03/12/2023 to again discuss antiestrogen therapy. We will await her referral back to [place] for postoperative follow-up and possible CT simulation/treatment planning.     We spoke about the latest technology that is used to minimize the risk of late effects  for patients undergoing radiotherapy to the breast or chest wall. No guarantees of treatment were given. We look forward to participating in the patient's care.     2. Possible genetic predisposition to malignancy. The patient is a candidate for genetic testing given her personal and family history. She has met with a geneticist and plans to get testing done.   In a visit lasting 60 minutes, greater than 50% of the time was spent face to face discussing the patient's condition, in preparation for the discussion, and coordinating the patient's care.   The above documentation reflects my direct findings during this shared patient visit. Please see the separate note by Dr. Mitzi Hansen on this date for the remainder of the patient's plan of care.    Joyice Faster, PA-C   **Disclaimer: This note was dictated with voice recognition software. Similar sounding words can inadvertently be transcribed and this note may contain transcription errors which may not have been corrected upon publication of note.**

## 2023-03-01 ENCOUNTER — Ambulatory Visit: Admission: RE | Admit: 2023-03-01 | Payer: Medicare Other | Source: Ambulatory Visit

## 2023-03-01 DIAGNOSIS — D0512 Intraductal carcinoma in situ of left breast: Secondary | ICD-10-CM | POA: Diagnosis not present

## 2023-03-01 HISTORY — PX: BREAST BIOPSY: SHX20

## 2023-03-02 ENCOUNTER — Ambulatory Visit (HOSPITAL_BASED_OUTPATIENT_CLINIC_OR_DEPARTMENT_OTHER): Payer: Medicare Other | Admitting: Certified Registered Nurse Anesthetist

## 2023-03-02 ENCOUNTER — Encounter (HOSPITAL_COMMUNITY)
Admission: RE | Disposition: A | Discharge status: Home / Self Care | Payer: Self-pay | Source: Ambulatory Visit | Attending: General Surgery

## 2023-03-02 ENCOUNTER — Ambulatory Visit (HOSPITAL_COMMUNITY)
Admission: RE | Admit: 2023-03-02 | Discharge: 2023-03-02 | Disposition: A | Discharge status: Home / Self Care | Payer: Medicare Other | Source: Ambulatory Visit | Attending: General Surgery | Admitting: General Surgery

## 2023-03-02 ENCOUNTER — Other Ambulatory Visit: Payer: Self-pay

## 2023-03-02 ENCOUNTER — Ambulatory Visit (HOSPITAL_COMMUNITY): Payer: Medicare Other | Admitting: Physician Assistant

## 2023-03-02 ENCOUNTER — Encounter (HOSPITAL_COMMUNITY): Payer: Self-pay | Admitting: General Surgery

## 2023-03-02 ENCOUNTER — Ambulatory Visit
Admission: RE | Admit: 2023-03-02 | Discharge: 2023-03-02 | Disposition: A | Discharge status: Home / Self Care | Payer: Medicare Other | Source: Ambulatory Visit | Attending: General Surgery | Admitting: General Surgery

## 2023-03-02 DIAGNOSIS — N6011 Diffuse cystic mastopathy of right breast: Secondary | ICD-10-CM | POA: Diagnosis not present

## 2023-03-02 DIAGNOSIS — D242 Benign neoplasm of left breast: Secondary | ICD-10-CM | POA: Diagnosis not present

## 2023-03-02 DIAGNOSIS — R921 Mammographic calcification found on diagnostic imaging of breast: Secondary | ICD-10-CM | POA: Diagnosis not present

## 2023-03-02 DIAGNOSIS — N6012 Diffuse cystic mastopathy of left breast: Secondary | ICD-10-CM | POA: Diagnosis not present

## 2023-03-02 DIAGNOSIS — D0512 Intraductal carcinoma in situ of left breast: Secondary | ICD-10-CM | POA: Diagnosis not present

## 2023-03-02 DIAGNOSIS — N62 Hypertrophy of breast: Secondary | ICD-10-CM | POA: Diagnosis not present

## 2023-03-02 DIAGNOSIS — Z45812 Encounter for adjustment or removal of left breast implant: Secondary | ICD-10-CM | POA: Diagnosis not present

## 2023-03-02 DIAGNOSIS — N6082 Other benign mammary dysplasias of left breast: Secondary | ICD-10-CM | POA: Diagnosis not present

## 2023-03-02 DIAGNOSIS — N6022 Fibroadenosis of left breast: Secondary | ICD-10-CM | POA: Diagnosis not present

## 2023-03-02 HISTORY — PX: BREAST LUMPECTOMY WITH RADIOACTIVE SEED LOCALIZATION: SHX6424

## 2023-03-02 SURGERY — BREAST LUMPECTOMY WITH RADIOACTIVE SEED LOCALIZATION
Anesthesia: General | Site: Breast | Laterality: Left

## 2023-03-02 MED ORDER — SODIUM CHLORIDE 0.9% FLUSH: 3.0000 mL | Freq: Two times a day (BID) | INTRAVENOUS | Status: DC

## 2023-03-02 MED ORDER — ACETAMINOPHEN 500 MG PO TABS: 1000.0000 mg | ORAL_TABLET | ORAL | Status: AC

## 2023-03-02 MED ORDER — CEFAZOLIN SODIUM-DEXTROSE 2-4 GM/100ML-% IV SOLN
2.0000 g | INTRAVENOUS | Status: AC
  Administered 2023-03-02: 2 g via INTRAVENOUS

## 2023-03-02 MED ORDER — PHENYLEPHRINE HCL-NACL 20-0.9 MG/250ML-% IV SOLN
INTRAVENOUS | Status: DC | PRN
  Administered 2023-03-02 (×2): 80 ug via INTRAVENOUS

## 2023-03-02 MED ORDER — ONDANSETRON HCL 4 MG/2ML IJ SOLN: 4.0000 mg | Freq: Once | INTRAMUSCULAR | Status: DC | PRN

## 2023-03-02 MED ORDER — BUPIVACAINE-EPINEPHRINE 0.25% -1:200000 IJ SOLN
INTRAMUSCULAR | Status: DC | PRN
  Administered 2023-03-02: 10 mL

## 2023-03-02 MED ORDER — ACETAMINOPHEN 650 MG RE SUPP: 650.0000 mg | RECTAL | Status: DC | PRN

## 2023-03-02 MED ORDER — SODIUM CHLORIDE 0.9 % IV SOLN: 250.0000 mL | INTRAVENOUS | Status: DC | PRN

## 2023-03-02 MED ORDER — DEXAMETHASONE SODIUM PHOSPHATE 10 MG/ML IJ SOLN
INTRAMUSCULAR | Status: AC
  Filled 2023-03-02: qty 1

## 2023-03-02 MED ORDER — SODIUM CHLORIDE 0.9% FLUSH: 3.0000 mL | INTRAVENOUS | Status: DC | PRN

## 2023-03-02 MED ORDER — OXYCODONE HCL 5 MG PO TABS: 5.0000 mg | ORAL_TABLET | ORAL | Status: DC | PRN

## 2023-03-02 MED ORDER — ACETAMINOPHEN 500 MG PO TABS
ORAL_TABLET | ORAL | Status: AC
  Administered 2023-03-02: 1000 mg via ORAL
  Filled 2023-03-02: qty 2

## 2023-03-02 MED ORDER — CHLORHEXIDINE GLUCONATE CLOTH 2 % EX PADS: 6.0000 | MEDICATED_PAD | Freq: Once | CUTANEOUS | Status: DC

## 2023-03-02 MED ORDER — FENTANYL CITRATE (PF) 250 MCG/5ML IJ SOLN
INTRAMUSCULAR | Status: AC
  Filled 2023-03-02: qty 5

## 2023-03-02 MED ORDER — OXYCODONE HCL 5 MG/5ML PO SOLN: 5.0000 mg | Freq: Once | ORAL | Status: DC | PRN

## 2023-03-02 MED ORDER — PROPOFOL 10 MG/ML IV BOLUS
INTRAVENOUS | Status: DC | PRN
  Administered 2023-03-02: 200 mg via INTRAVENOUS

## 2023-03-02 MED ORDER — OXYCODONE HCL 5 MG PO TABS: 5.0000 mg | ORAL_TABLET | Freq: Once | ORAL | Status: DC | PRN

## 2023-03-02 MED ORDER — EPHEDRINE SULFATE-NACL 50-0.9 MG/10ML-% IV SOSY
PREFILLED_SYRINGE | INTRAVENOUS | Status: DC | PRN
  Administered 2023-03-02: 10 mg via INTRAVENOUS
  Administered 2023-03-02: 7.5 mg via INTRAVENOUS
  Administered 2023-03-02: 10 mg via INTRAVENOUS
  Administered 2023-03-02: 5 mg via INTRAVENOUS
  Administered 2023-03-02: 2.5 mg via INTRAVENOUS

## 2023-03-02 MED ORDER — DEXAMETHASONE SODIUM PHOSPHATE 10 MG/ML IJ SOLN
INTRAMUSCULAR | Status: DC | PRN
  Administered 2023-03-02: 10 mg via INTRAVENOUS

## 2023-03-02 MED ORDER — CHLORHEXIDINE GLUCONATE 0.12 % MT SOLN
OROMUCOSAL | Status: AC
  Administered 2023-03-02: 15 mL
  Filled 2023-03-02: qty 15

## 2023-03-02 MED ORDER — ACETAMINOPHEN 325 MG PO TABS: 650.0000 mg | ORAL_TABLET | ORAL | Status: DC | PRN

## 2023-03-02 MED ORDER — BUPIVACAINE-EPINEPHRINE (PF) 0.25% -1:200000 IJ SOLN
INTRAMUSCULAR | Status: AC
  Filled 2023-03-02: qty 30

## 2023-03-02 MED ORDER — CEFAZOLIN SODIUM-DEXTROSE 2-4 GM/100ML-% IV SOLN
INTRAVENOUS | Status: AC
  Filled 2023-03-02: qty 100

## 2023-03-02 MED ORDER — 0.9 % SODIUM CHLORIDE (POUR BTL) OPTIME
TOPICAL | Status: DC | PRN
  Administered 2023-03-02: 1000 mL

## 2023-03-02 MED ORDER — LACTATED RINGERS IV SOLN: INTRAVENOUS | Status: DC | PRN

## 2023-03-02 MED ORDER — ENSURE PRE-SURGERY PO LIQD: 296.0000 mL | Freq: Once | ORAL | Status: DC

## 2023-03-02 MED ORDER — LIDOCAINE 2% (20 MG/ML) 5 ML SYRINGE
INTRAMUSCULAR | Status: DC | PRN
  Administered 2023-03-02: 100 mg via INTRAVENOUS

## 2023-03-02 MED ORDER — FENTANYL CITRATE (PF) 250 MCG/5ML IJ SOLN
INTRAMUSCULAR | Status: DC | PRN
  Administered 2023-03-02: 50 ug via INTRAVENOUS

## 2023-03-02 MED ORDER — PROPOFOL 10 MG/ML IV BOLUS
INTRAVENOUS | Status: AC
  Filled 2023-03-02: qty 20

## 2023-03-02 MED ORDER — ONDANSETRON HCL 4 MG/2ML IJ SOLN
INTRAMUSCULAR | Status: AC
  Filled 2023-03-02: qty 2

## 2023-03-02 MED ORDER — ONDANSETRON HCL 4 MG/2ML IJ SOLN
INTRAMUSCULAR | Status: DC | PRN
Start: 2023-03-02 — End: 2023-03-02
  Administered 2023-03-02: 4 mg via INTRAVENOUS

## 2023-03-02 MED ORDER — FENTANYL CITRATE (PF) 100 MCG/2ML IJ SOLN: 25.0000 ug | INTRAMUSCULAR | Status: DC | PRN

## 2023-03-02 SURGICAL SUPPLY — 42 items
ADH SKN CLS APL DERMABOND .7 (GAUZE/BANDAGES/DRESSINGS) ×1
APL PRP STRL LF DISP 70% ISPRP (MISCELLANEOUS) ×1
APPLIER CLIP 9.375 MED OPEN (MISCELLANEOUS) ×1
APR CLP MED 9.3 20 MLT OPN (MISCELLANEOUS) ×1
BAG COUNTER SPONGE SURGICOUNT (BAG) ×1 IMPLANT
BAG SPNG CNTER NS LX DISP (BAG) ×1
BINDER BREAST LRG (GAUZE/BANDAGES/DRESSINGS) IMPLANT
BINDER BREAST XLRG (GAUZE/BANDAGES/DRESSINGS) IMPLANT
CANISTER SUCT 3000ML PPV (MISCELLANEOUS) ×1 IMPLANT
CHLORAPREP W/TINT 26 (MISCELLANEOUS) ×1 IMPLANT
CLIP APPLIE 9.375 MED OPEN (MISCELLANEOUS) IMPLANT
CLIP TI MEDIUM 6 (CLIP) ×1 IMPLANT
COVER PROBE W GEL 5X96 (DRAPES) ×1 IMPLANT
COVER SURGICAL LIGHT HANDLE (MISCELLANEOUS) ×1 IMPLANT
DERMABOND ADVANCED .7 DNX12 (GAUZE/BANDAGES/DRESSINGS) ×1 IMPLANT
DEVICE DUBIN SPECIMEN MAMMOGRA (MISCELLANEOUS) ×1 IMPLANT
DRAPE CHEST BREAST 15X10 FENES (DRAPES) ×1 IMPLANT
ELECT COATED BLADE 2.86 ST (ELECTRODE) ×1 IMPLANT
ELECT REM PT RETURN 9FT ADLT (ELECTROSURGICAL) ×1
ELECTRODE REM PT RTRN 9FT ADLT (ELECTROSURGICAL) ×1 IMPLANT
GLOVE BIO SURGEON STRL SZ7 (GLOVE) ×2 IMPLANT
GLOVE BIOGEL PI IND STRL 7.5 (GLOVE) ×1 IMPLANT
GOWN STRL REUS W/ TWL LRG LVL3 (GOWN DISPOSABLE) ×2 IMPLANT
GOWN STRL REUS W/TWL LRG LVL3 (GOWN DISPOSABLE) ×2
KIT BASIN OR (CUSTOM PROCEDURE TRAY) ×1 IMPLANT
KIT MARKER MARGIN INK (KITS) ×1 IMPLANT
LIGHT WAVEGUIDE WIDE FLAT (MISCELLANEOUS) IMPLANT
NDL HYPO 25GX1X1/2 BEV (NEEDLE) ×1 IMPLANT
NEEDLE HYPO 25GX1X1/2 BEV (NEEDLE) ×1
NS IRRIG 1000ML POUR BTL (IV SOLUTION) ×1 IMPLANT
PACK GENERAL/GYN (CUSTOM PROCEDURE TRAY) ×1 IMPLANT
STRIP CLOSURE SKIN 1/2X4 (GAUZE/BANDAGES/DRESSINGS) ×1 IMPLANT
SUT MNCRL AB 4-0 PS2 18 (SUTURE) ×1 IMPLANT
SUT MON AB 5-0 PS2 18 (SUTURE) IMPLANT
SUT SILK 2 0 SH (SUTURE) IMPLANT
SUT VIC AB 2-0 SH 27 (SUTURE) ×1
SUT VIC AB 2-0 SH 27XBRD (SUTURE) ×1 IMPLANT
SUT VIC AB 3-0 SH 27 (SUTURE) ×1
SUT VIC AB 3-0 SH 27X BRD (SUTURE) ×1 IMPLANT
SYR CONTROL 10ML LL (SYRINGE) ×1 IMPLANT
TOWEL GREEN STERILE (TOWEL DISPOSABLE) ×1 IMPLANT
TOWEL GREEN STERILE FF (TOWEL DISPOSABLE) ×1 IMPLANT

## 2023-03-02 NOTE — Anesthesia Postprocedure Evaluation (Signed)
Anesthesia Post Note  Patient: Renee Smith  Procedure(s) Performed: LEFT BREAST SEED GUIDED LUMPECTOMY (Left: Breast)     Patient location during evaluation: PACU Anesthesia Type: General Level of consciousness: awake and alert Pain management: pain level controlled Vital Signs Assessment: post-procedure vital signs reviewed and stable Respiratory status: spontaneous breathing, nonlabored ventilation, respiratory function stable and patient connected to nasal cannula oxygen Cardiovascular status: blood pressure returned to baseline and stable Postop Assessment: no apparent nausea or vomiting Anesthetic complications: no  No notable events documented.  Last Vitals:  Vitals:   03/02/23 0845 03/02/23 0856  BP: 125/73 122/63  Pulse: 70 68  Resp: 15 18  Temp:    SpO2: 98% 97%    Last Pain:  Vitals:   03/02/23 0833  TempSrc:   PainSc: 0-No pain                 [name]

## 2023-03-02 NOTE — Anesthesia Procedure Notes (Signed)
Procedure Name: LMA Insertion Date/Time: 03/02/2023 7:48 AM  Performed by: Alease Medina, CRNAPre-anesthesia Checklist: Patient identified, Emergency Drugs available, Suction available and Patient being monitored Patient Re-evaluated:Patient Re-evaluated prior to induction Oxygen Delivery Method: Circle system utilized Preoxygenation: Pre-oxygenation with 100% oxygen Induction Type: IV induction Ventilation: Mask ventilation without difficulty LMA: LMA inserted LMA Size: 4.0 Tube type: Oral Number of attempts: 2 Placement Confirmation: positive ETCO2, breath sounds checked- equal and bilateral and CO2 detector Tube secured with: Tape Dental Injury: Teeth and Oropharynx as per pre-operative assessment

## 2023-03-02 NOTE — Anesthesia Preprocedure Evaluation (Signed)
Anesthesia Evaluation  Patient identified by MRN, date of birth, ID band Patient awake    Reviewed: Allergy & Precautions, H&P , NPO status , Patient's Chart, lab work & pertinent test results  Airway Mallampati: II  TM Distance: >3 FB Neck ROM: Full    Dental no notable dental hx.    Pulmonary neg pulmonary ROS   Pulmonary exam normal breath sounds clear to auscultation       Cardiovascular negative cardio ROS Normal cardiovascular exam Rhythm:Regular Rate:Normal     Neuro/Psych negative neurological ROS  negative psych ROS   GI/Hepatic negative GI ROS, Neg liver ROS,,,  Endo/Other  negative endocrine ROS    Renal/GU negative Renal ROS  negative genitourinary   Musculoskeletal negative musculoskeletal ROS (+)    Abdominal   Peds negative pediatric ROS (+)  Hematology negative hematology ROS (+)   Anesthesia Other Findings   Reproductive/Obstetrics negative OB ROS                             Anesthesia Physical Anesthesia Plan  ASA: 2  Anesthesia Plan: General   Post-op Pain Management: Tylenol PO (pre-op)*   Induction: Intravenous  PONV Risk Score and Plan: 3 and Ondansetron, Dexamethasone and Treatment may vary due to age or medical condition  Airway Management Planned: LMA  Additional Equipment:   Intra-op Plan:   Post-operative Plan: Extubation in OR  Informed Consent: I have reviewed the patients History and Physical, chart, labs and discussed the procedure including the risks, benefits and alternatives for the proposed anesthesia with the patient or authorized representative who has indicated his/her understanding and acceptance.     Dental advisory given  Plan Discussed with: CRNA and Surgeon  Anesthesia Plan Comments:        Anesthesia Quick Evaluation

## 2023-03-02 NOTE — Transfer of Care (Signed)
Immediate Anesthesia Transfer of Care Note  Patient: Renee Smith  Procedure(s) Performed: LEFT BREAST SEED GUIDED LUMPECTOMY (Left: Breast)  Patient Location: PACU  Anesthesia Type:General  Level of Consciousness: awake, alert , oriented, and patient cooperative  Airway & Oxygen Therapy: Patient Spontanous Breathing and Patient connected to face mask oxygen  Post-op Assessment: Report given to RN, Post -op Vital signs reviewed and stable, and Patient moving all extremities X 4  Post vital signs: Reviewed and stable  Last Vitals:  Vitals Value Taken Time  BP 120/67 03/02/23 0833  Temp    Pulse 71 03/02/23 0835  Resp 19   SpO2 99 % 03/02/23 0835  Vitals shown include unfiled device data.  Last Pain:  Vitals:   03/02/23 0630  TempSrc:   PainSc: 0-No pain         Complications: No notable events documented.

## 2023-03-02 NOTE — Op Note (Signed)
Preoperative diagnosis: Left breast ductal carcinoma in situ Postoperative diagnosis: Same as above Procedure: Left breast radioactive seed guided lumpectomy Surgical Dr. Harden Mo Anesthesia: General Estimated blood loss: Minimal Specimens: 1.  Left breast tissue marked with paint containing coil clip, seed was sent separately and confirmed by mammography 2.  Additional anterior margin containing BX clip this was marked short superior, long lateral, double deep Complication: Drains: Sponge and counts correct completion Disposition recovery stable addition  Indications: This is 77 year old female who presented with a screening mammogram that showed left breast retroareolar calcifications.  She had 2 small areas of retroareolar calcifications measured a total of 40 mm.  Biopsy of 1 with the X clip as a papilloma and the coil clip at grade 2 DCIS.  We discussed a lumpectomy.  Procedure: After informed consent was obtained she was taken to the operating room.  She was given antibiotics.  SCDs were placed.  She was placed under general anesthesia without complication.  She was prepped and draped in a sterile sterile surgical fashion.  Surgical timeout was then performed.  I infiltrated Marcaine throughout the area.  I then made a periareolar incision laterally in order to hide the scar later.  I dissected using the probe and remove the radioactive seed in the surrounding tissue with an attempt to get a clear margin.  The seed was at the anterior part of this so I did remove it separately and confirmed this with mammography.  The coil clip was present in the specimen.  I then removed the tissue anterior to this all the way up to the nipple areolar complex.  This contained the X clip.  I then obtained hemostasis.  I placed a couple clips in the cavity.  I closed the breast tissue down with 2-0 Vicryl.  The skin was closed with 3-0 Vicryl and 5-0 Monocryl.  Glue and Steri-Strips were applied.  She  tolerated this well was extubated and transferred recovery stable.

## 2023-03-02 NOTE — Discharge Instructions (Signed)
[facility] Office Phone Number [phone]  POST OP INSTRUCTIONS Take 400 mg of ibuprofen every 8 hours or 650 mg tylenol every 6 hours for next 72 hours then as needed. Use ice several times daily also.  A prescription for pain medication may be given to you upon discharge.  Take your pain medication as prescribed, if needed.  If narcotic pain medicine is not needed, then you may take acetaminophen (Tylenol), naprosyn (Alleve) or ibuprofen (Advil) as needed. Take your usually prescribed medications unless otherwise directed If you need a refill on your pain medication, please contact your pharmacy.  They will contact our office to request authorization.  Prescriptions will not be filled after 5pm or on week-ends. You should eat very light the first 24 hours after surgery, such as soup, crackers, pudding, etc.  Resume your normal diet the day after surgery. Most patients will experience some swelling and bruising in the breast.  Ice packs and a good support bra will help.  Wear the breast binder provided or a sports bra for 72 hours day and night.  After that wear a sports bra during the day until you return to the office. Swelling and bruising can take several days to resolve.  It is common to experience some constipation if taking pain medication after surgery.  Increasing fluid intake and taking a stool softener will usually help or prevent this problem from occurring.  A mild laxative (Milk of Magnesia or Miralax) should be taken according to package directions if there are no bowel movements after 48 hours. I used skin glue on the incision, you may shower in 24 hours.  The glue will flake off over the next 2-3 weeks.  Any sutures or staples will be removed at the office during your follow-up visit. ACTIVITIES:  You may resume regular daily activities (gradually increasing) beginning the next day.  Wearing a good support bra or sports bra minimizes pain and swelling.  You may have  sexual intercourse when it is comfortable. You may drive when you no longer are taking prescription pain medication, you can comfortably wear a seatbelt, and you can safely maneuver your car and apply brakes. RETURN TO WORK:  ______________________________________________________________________________________ You should see your doctor in the office for a follow-up appointment approximately two weeks after your surgery.  Your doctor's nurse will typically make your follow-up appointment when she calls you with your pathology report.  Expect your pathology report 3-4 business days after your surgery.  You may call to check if you do not hear from us after three days. OTHER INSTRUCTIONS: _______________________________________________________________________________________________ _____________________________________________________________________________________________________________________________________ _____________________________________________________________________________________________________________________________________ _____________________________________________________________________________________________________________________________________  WHEN TO CALL DR WAKEFIELD: Fever over 101.0 Nausea and/or vomiting. Extreme swelling or bruising. Continued bleeding from incision. Increased pain, redness, or drainage from the incision.  The clinic staff is available to answer your questions during regular business hours.  Please don't hesitate to call and ask to speak to one of the nurses for clinical concerns.  If you have a medical emergency, go to the nearest emergency room or call 911.  A surgeon from [facility] is always on call at the hospital.  For further questions, please visit [email or web address]  

## 2023-03-02 NOTE — Interval H&P Note (Signed)
History and Physical Interval Note:  03/02/2023 7:01 AM  Renee Smith  has presented today for surgery, with the diagnosis of LEFT BREAST DUCTAL CARCINOMA IN SITU.  The various methods of treatment have been discussed with the patient and family. After consideration of risks, benefits and other options for treatment, the patient has consented to  Procedure(s) with comments: LEFT BREAST SEED GUIDED LUMPECTOMY (Left) - LMA as a surgical intervention.  The patient's history has been reviewed, patient examined, no change in status, stable for surgery.  I have reviewed the patient's chart and labs.  Questions were answered to the patient's satisfaction.     Emelia Loron

## 2023-03-02 NOTE — H&P (Signed)
77 yo healthy female who presents with screening mm that shows left retroareolar calcs. She has prior benign breast biopsies both radiologic and one surgical. She has no mass or dc. Has B density tissue on mammmogram. There are two small areas of RA calcs measuring total of 14 mm. Ax [place] is negative. Biopsy of one is intraductal papilloma without any atypia and the other is II DCIS that is 95% er pos, 60% pr pos. The x clip denotes papilloma and the coil clip migrated from site. She is here with her husband to discuss options.   Review of Systems: A complete review of systems was obtained from the patient. I have reviewed this information and discussed as appropriate with the patient. See HPI as well for other ROS.  Review of Systems  HENT: Positive for hearing loss.  Psychiatric/Behavioral: Positive for memory loss.  All other systems reviewed and are negative.   Medical History: Past Medical History:  Diagnosis Date  Thyroid disease    Past Surgical History:  Procedure Laterality Date  c-sections  1975 1978  JOINT REPLACEMENT   No Known Allergies  Current Outpatient Medications on File Prior to Visit  Medication Sig Dispense Refill  atenoloL (TENORMIN) 25 MG tablet Take 25 mg by mouth once daily  methIMAzole (TAPAZOLE) 5 MG tablet Take 5 mg by mouth once daily   Family History  Problem Relation Age of Onset  Breast cancer Sister    Social History   Tobacco Use  Smoking Status Never  Smokeless Tobacco Never  Marital status: Married  Tobacco Use  Smoking status: Never  Smokeless tobacco: Never  Substance and Sexual Activity  Alcohol use: Yes  Drug use: Not Currently   Objective:   Vitals:  01/29/23 1033  BP: (!) 148/82  Pulse: 86  Weight: 72.7 kg (160 lb 3.2 oz)  Height: 154.9 cm (5\' 1" )   Body mass index is 30.27 kg/m.  Physical Exam Vitals reviewed.  Constitutional:  Appearance: Normal appearance.  Chest:  Breasts: Right: No inverted nipple, mass  or nipple discharge.  Left: No inverted nipple, mass or nipple discharge.  Lymphadenopathy:  Upper Body:  Right upper body: No supraclavicular or axillary adenopathy.  Left upper body: No supraclavicular or axillary adenopathy.  Neurological:  Mental Status: She is alert.    Assessment and Plan:   Ductal carcinoma in situ of left breast  Left breast seed guided lumpectomy  Will plan to remove both areas of calcifications Will refer her to genetics I went over why I dont think mr is mandatory as she was told- discussed pros and cons. We elected not to proceed.  We discussed the staging and pathophysiology of breast cancer. We discussed noninvasive nature of dcis. We discussed all of the different options for treatment for breast cancer including surgery, radiation therapy, and antiestrogen therapy.  She does not need a sn biopsy for this given dcis.   We discussed the options for treatment of the breast cancer which included lumpectomy versus a mastectomy. We discussed the performance of the lumpectomy with radioactive seed placement. We discussed a 5-10% chance of a positive margin requiring reexcision in the operating room. We also discussed that she might be recommended radiation therapy if she undergoes lumpectomy. There are a variety of ways this could be delivered. We discussed mastectomy and the postoperative care for that as well. Mastectomy can be followed by reconstruction. We discussed that there is no difference in her survival whether she undergoes lumpectomy with radiation  therapy or antiestrogen therapy versus a mastectomy. There is also no real difference between her recurrence in the breast.  We discussed the risks of operation including bleeding, infection, possible reoperation. She understands her further therapy will be based on what her stages at the time of her operation.

## 2023-03-03 ENCOUNTER — Encounter (HOSPITAL_COMMUNITY): Payer: Self-pay | Admitting: General Surgery

## 2023-03-05 LAB — SURGICAL PATHOLOGY

## 2023-03-08 ENCOUNTER — Telehealth: Payer: Self-pay | Admitting: Hematology and Oncology

## 2023-03-08 ENCOUNTER — Other Ambulatory Visit: Payer: Self-pay | Admitting: *Deleted

## 2023-03-08 ENCOUNTER — Telehealth: Payer: Self-pay | Admitting: Genetic Counselor

## 2023-03-08 DIAGNOSIS — D0512 Intraductal carcinoma in situ of left breast: Secondary | ICD-10-CM

## 2023-03-08 NOTE — Telephone Encounter (Signed)
Rescheduled appointment per provider PAL. Patient is aware of the changes made to her upcoming appointment. 

## 2023-03-08 NOTE — Telephone Encounter (Signed)
Revealed negative genetic testing.  Discussed that we do not know why she has breast cancer cancer . It could be due to a different gene that we are not testing, or maybe our current technology may not be able to pick something up.  It will be important for her to keep in contact with genetics to keep up with whether additional testing may be needed.

## 2023-03-08 NOTE — Telephone Encounter (Signed)
LM on VM that results are back and to please call.  Left CB instructions. 

## 2023-03-09 ENCOUNTER — Inpatient Hospital Stay: Payer: Medicare Other | Admitting: Hematology and Oncology

## 2023-03-09 ENCOUNTER — Telehealth: Payer: Self-pay | Admitting: Radiation Oncology

## 2023-03-09 VITALS — BP 150/70 | HR 60 | Temp 97.8°F | Resp 18 | Ht 61.0 in

## 2023-03-09 NOTE — Progress Notes (Signed)
   Patient Care Team: Thana Ates, MD as PCP - General (Internal Medicine) Janalyn Harder, MD (Inactive) as Consulting Physician (Dermatology) Donnelly Angelica, RN as Oncology Nurse Navigator Pershing Proud, RN as Oncology Nurse Navigator Serena Croissant, MD as Consulting Physician (Hematology and Oncology) Emelia Loron, MD as Consulting Physician (General Surgery)  DIAGNOSIS:  Encounter Diagnosis  Name Primary?   Ductal carcinoma in situ (DCIS) of left breast Yes    SUMMARY OF ONCOLOGIC HISTORY: Oncology History  Ductal carcinoma in situ (DCIS) of left breast  12/24/2022 Initial Diagnosis   Screening mammogram detected left breast calcifications subareolar, 2 adjacent groups spanning 5 mm and 8 mm together 1.4 cm, ultrasound measured 4 mm. 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    02/24/2023 Cancer Staging   Staging form: Breast, AJCC 8th Edition - Clinical: Stage 0 (cTis (DCIS), cN0, cM0, G2, ER+, PR+, HER2: Not Assessed) - Signed by Serena Croissant, MD on 02/24/2023 Stage prefix: Initial diagnosis Histologic grading system: 3 grade system     CHIEF COMPLIANT:   INTERVAL HISTORY: Renee Smith is a   ALLERGIES:  has No Known Allergies.  MEDICATIONS:  Current Outpatient Medications  Medication Sig Dispense Refill   atenolol (TENORMIN) 25 MG tablet Take 25 mg by mouth in the morning.     CALCIUM PO Take 1 tablet by mouth in the morning. (Patient not taking: Reported on 02/26/2023)     Cholecalciferol (VITAMIN D-3 PO) Take 2 tablets by mouth in the morning. (Patient not taking: Reported on 02/26/2023)     ibuprofen (ADVIL) 200 MG tablet Take 400 mg by mouth every 8 (eight) hours as needed (pain/headache.).     methimazole (TAPAZOLE) 5 MG tablet Take 10 mg by mouth in the morning. Dose increased for upcoming surgery.     No current  facility-administered medications for this visit.    PHYSICAL EXAMINATION: ECOG PERFORMANCE STATUS: {CHL ONC ECOG PS:[id number]}  There were no vitals filed for this visit. There were no vitals filed for this visit.  BREAST:*** No palpable masses or nodules in either right or left breasts. No palpable axillary supraclavicular or infraclavicular adenopathy no breast tenderness or nipple discharge. (exam performed in the presence of a chaperone)  LABORATORY DATA:  I have reviewed the data as listed     No data to display          No results found for: "WBC", "HGB", "HCT", "MCV", "PLT", "NEUTROABS"  ASSESSMENT & PLAN:  No problem-specific Assessment & Plan notes found for this encounter.    No orders of the defined types were placed in this encounter.  The patient has a good understanding of the overall plan. she agrees with it. she will call with any problems that may develop before the next visit here. Total time spent: 30 mins including face to face time and time spent for planning, charting and co-ordination of care   Sherlyn Lick, CMA 03/09/23    I Janan Ridge am acting as a Neurosurgeon for [employer]  ***

## 2023-03-09 NOTE — Assessment & Plan Note (Signed)
12/24/2022: Screening mammogram detected left breast calcifications subareolar, 2 adjacent groups spanning 5 mm and 8 mm together 1.4 cm, ultrasound measured 4 mm. Stereotactic biopsies, left breast retroareolar: Intraductal papilloma with UDH, second biopsy retroareolar: Intermediate grade DCIS ER 75%, PR 60%  03/02/2023: Left lumpectomy: No residual DCIS.  Intraductal papilloma and CSL.  Treatment plan: Adjuvant radiation therapy Followed by adjuvant antiestrogen therapy with tamoxifen x 5 years  Return to clinic after radiation is completed.

## 2023-03-09 NOTE — Telephone Encounter (Signed)
Called patient to schedule consult per 8/26 referral. Patient requested to wait until she spoke to Dr. Pamelia Hoit first.

## 2023-03-10 ENCOUNTER — Telehealth: Payer: Self-pay | Admitting: Hematology and Oncology

## 2023-03-10 ENCOUNTER — Inpatient Hospital Stay (HOSPITAL_BASED_OUTPATIENT_CLINIC_OR_DEPARTMENT_OTHER): Payer: Medicare Other | Admitting: Hematology and Oncology

## 2023-03-10 ENCOUNTER — Ambulatory Visit: Payer: Self-pay | Admitting: Genetic Counselor

## 2023-03-10 DIAGNOSIS — D0512 Intraductal carcinoma in situ of left breast: Secondary | ICD-10-CM | POA: Diagnosis not present

## 2023-03-10 DIAGNOSIS — Z1379 Encounter for other screening for genetic and chromosomal anomalies: Secondary | ICD-10-CM | POA: Insufficient documentation

## 2023-03-10 NOTE — Progress Notes (Signed)
HPI:  [name] was previously seen in the [facility] due to a personal and family history of cancer, her Ashkenazi Jewish ancestry and concerns regarding a hereditary predisposition to cancer. Please refer to our prior cancer genetics clinic note for more information regarding our discussion, assessment and recommendations, at the time. [name] recent genetic test results were disclosed to her, as were recommendations warranted by these results. These results and recommendations are discussed in more detail below.  CANCER HISTORY:  Oncology History  Ductal carcinoma in situ (DCIS) of left breast  12/24/2022 Initial Diagnosis   Screening mammogram detected left breast calcifications subareolar, 2 adjacent groups spanning 5 mm and 8 mm together 1.4 cm, ultrasound measured 4 mm. 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    02/24/2023 Cancer Staging   Staging form: Breast, AJCC 8th Edition - Clinical: Stage 0 (cTis (DCIS), cN0, cM0, G2, ER+, PR+, HER2: Not Assessed) - Signed by Serena Croissant, MD on 02/24/2023 Stage prefix: Initial diagnosis Histologic grading system: 3 grade system     FAMILY HISTORY:  We obtained a detailed, 4-generation family history.  Significant diagnoses are listed below: Family History  Problem Relation Age of Onset   Breast cancer Sister        dx. early 46s, d. 72   Heart disease Maternal Grandmother    Heart attack Maternal Grandfather    Breast cancer Cousin        pat first cousin       The patient has three sons who are cancer free. She has a brother and sister.  The sister developed breast cancer in her early 41's and died at 47.  Both parents are deceased.    The patient's mother died in her 11's.  She had several siblings who did not have cancer. There is no other reported family history of cancer.   The  patient's father died at 60.  He had multiple siblings who were cancer free.  One sister had a daughter with breast cancer.   [name] is aware of previous family history of genetic testing for hereditary cancer risks. Patient's maternal ancestors are of [place] descent, and paternal ancestors are of [place] descent. There is reported Ashkenazi Jewish ancestry. There is no known consanguinity.  GENETIC TEST RESULTS: Genetic testing reported out on March 08, 2023 through the CancerNext-Expanded+RNAinsight cancer panel found no pathogenic mutations. The CancerNext-Expanded gene panel offered by [facility] and includes sequencing and rearrangement analysis for the following 77 genes: AIP, ALK, APC*, ATM*, AXIN2, BAP1, BARD1, BMPR1A, BRCA1*, BRCA2*, BRIP1*, CDC73, CDH1*, CDK4, CDKN1B, CDKN2A, CHEK2*, CTNNA1, DICER1, FH, FLCN, KIF1B, LZTR1, MAX, MEN1, MET, MLH1*, MSH2*, MSH3, MSH6*, MUTYH*, NF1*, NF2, NTHL1, PALB2*, PHOX2B, PMS2*, POT1, PRKAR1A, PTCH1, PTEN*, RAD51C*, RAD51D*, RB1, RET, SDHA, SDHAF2, SDHB, SDHC, SDHD, SMAD4, SMARCA4, SMARCB1, SMARCE1, STK11, SUFU, TMEM127, TP53*, TSC1, TSC2, and VHL (sequencing and deletion/duplication); EGFR, EGLN1, HOXB13, KIT, MITF, PDGFRA, POLD1, and POLE (sequencing only); EPCAM and GREM1 (deletion/duplication only). DNA and RNA analyses performed for * genes. The test report has been scanned into EPIC and is located under the Molecular Pathology section of the Results Review tab.  A portion of the result report is included below for reference.     We discussed with [name] that because current genetic testing is not perfect, it is possible there may be a gene mutation in one of these genes that current  testing cannot detect, but that chance is small.  We also discussed, that there could be another gene that has not yet been discovered, or that we have not yet tested, that is responsible for the cancer diagnoses in the family. It is also possible there is a  hereditary cause for the cancer in the family that [name] did not inherit and therefore was not identified in her testing.  Therefore, it is important to remain in touch with cancer genetics in the future so that we can continue to offer [name] the most up to date genetic testing.   ADDITIONAL GENETIC TESTING: We discussed with [name] that her genetic testing was fairly extensive.  If there are genes identified to increase cancer risk that can be analyzed in the future, we would be happy to discuss and coordinate this testing at that time.    CANCER SCREENING RECOMMENDATIONS: [name] test result is considered negative (normal).  This means that we have not identified a hereditary cause for her personal and family history of cancer at this time. Most cancers happen by chance and this negative test suggests that her personal and family history of cancer may fall into this category.    Possible reasons for [name] negative genetic test include:  1. There may be a gene mutation in one of these genes that current testing methods cannot detect but that chance is small.  2. There could be another gene that has not yet been discovered, or that we have not yet tested, that is responsible for the cancer diagnoses in the family.  3.  There may be no hereditary risk for cancer in the family. The cancers in [name] and/or her family may be sporadic/familial or due to other genetic and environmental factors. 4. It is also possible there is a hereditary cause for the cancer in the family that [name] did not inherit.  Therefore, it is recommended she continue to follow the cancer management and screening guidelines provided by her oncology and primary healthcare provider. An individual's cancer risk and medical management are not determined by genetic test results alone. Overall cancer risk assessment incorporates additional factors, including personal medical history, family history, and  any available genetic information that may result in a personalized plan for cancer prevention and surveillance  RECOMMENDATIONS FOR FAMILY MEMBERS:  Individuals in this family might be at some increased risk of developing cancer, over the general population risk, simply due to the family history of cancer.  We recommended women in this family have a yearly mammogram beginning at age 30, or 87 years younger than the earliest onset of cancer, an annual clinical breast exam, and perform monthly breast self-exams. Women in this family should also have a gynecological exam as recommended by their primary provider. All family members should be referred for colonoscopy starting at age 33.  FOLLOW-UP: Lastly, we discussed with [name] that cancer genetics is a rapidly advancing field and it is possible that new genetic tests will be appropriate for her and/or her family members in the future. We encouraged her to remain in contact with cancer genetics on an annual basis so we can update her personal and family histories and let her know of advances in cancer genetics that may benefit this family.   Our contact number was provided. [name] questions were answered to her satisfaction, and she knows she is welcome to call us at anytime with additional questions or concerns.   Renee Smith  Lowell Guitar, MS, [facility] Licensed, Patent attorney [email or web address]

## 2023-03-10 NOTE — Assessment & Plan Note (Addendum)
12/24/2022: Screening mammogram detected left breast calcifications subareolar, 2 adjacent groups spanning 5 mm and 8 mm together 1.4 cm, ultrasound measured 4 mm. Stereotactic biopsies, left breast retroareolar: Intraductal papilloma with UDH, second biopsy retroareolar: Intermediate grade DCIS ER 75%, PR 60%  03/02/2023: Left lumpectomy: No residual DCIS.  Intraductal papilloma and CSL.  Treatment plan: Adjuvant radiation therapy: Patient is contemplating on pros and cons of doing radiation.  Encouraged her to make an appointment with radiation. Followed by adjuvant antiestrogen therapy with tamoxifen x 5 years  Return to clinic after radiation is completed.  If she does not receive radiation and we will start her on tamoxifen daily for 5 years.

## 2023-03-10 NOTE — Progress Notes (Signed)
HEMATOLOGY-ONCOLOGY TELEPHONE VISIT PROGRESS NOTE  I connected with our patient on 03/10/23 at 12:30 PM EDT by telephone and verified that I am speaking with the correct person using two identifiers.  I discussed the limitations, risks, security and privacy concerns of performing an evaluation and management service by telephone and the availability of in person appointments.  I also discussed with the patient that there may be a patient responsible charge related to this service. The patient expressed understanding and agreed to proceed.   History of Present Illness: Follow-up to discuss pros and cons of radiation.  Patient underwent left lumpectomy in 03/02/2023 and is relieved to hear that there is no additional evidence of invasive breast cancer or DCIS.  Oncology History  Ductal carcinoma in situ (DCIS) of left breast  12/24/2022 Initial Diagnosis   Screening mammogram detected left breast calcifications subareolar, 2 adjacent groups spanning 5 mm and 8 mm together 1.4 cm, ultrasound measured 4 mm. 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    02/24/2023 Cancer Staging   Staging form: Breast, AJCC 8th Edition - Clinical: Stage 0 (cTis (DCIS), cN0, cM0, G2, ER+, PR+, HER2: Not Assessed) - Signed by Serena Croissant, MD on 02/24/2023 Stage prefix: Initial diagnosis Histologic grading system: 3 grade system     REVIEW OF SYSTEMS:   Constitutional: Denies fevers, chills or abnormal weight loss All other systems were reviewed with the patient and are negative. Observations/Objective:     Assessment Plan:  Ductal carcinoma in situ (DCIS) of left breast 12/24/2022: Screening mammogram detected left breast calcifications subareolar, 2 adjacent groups spanning 5 mm and 8 mm together 1.4 cm, ultrasound measured 4 mm. Stereotactic biopsies, left breast retroareolar: Intraductal  papilloma with UDH, second biopsy retroareolar: Intermediate grade DCIS ER 75%, PR 60%  03/02/2023: Left lumpectomy: No residual DCIS.  Intraductal papilloma and CSL.  Treatment plan: Adjuvant radiation therapy: Patient is contemplating on pros and cons of doing radiation.  Encouraged her to make an appointment with radiation. Followed by adjuvant antiestrogen therapy with tamoxifen x 5 years  Return to clinic after radiation is completed.  If she does not receive radiation and we will start her on tamoxifen daily for 5 years.    I discussed the assessment and treatment plan with the patient. The patient was provided an opportunity to ask questions and all were answered. The patient agreed with the plan and demonstrated an understanding of the instructions. The patient was advised to call back or seek an in-person evaluation if the symptoms worsen or if the condition fails to improve as anticipated.   I provided 12 minutes of non-face-to-face time during this encounter.  This includes time for charting and coordination of care   Tamsen Meek, MD

## 2023-03-11 ENCOUNTER — Encounter: Payer: Self-pay | Admitting: *Deleted

## 2023-03-12 ENCOUNTER — Inpatient Hospital Stay: Payer: Medicare Other | Admitting: Hematology and Oncology

## 2023-03-18 ENCOUNTER — Encounter: Payer: Self-pay | Admitting: *Deleted

## 2023-03-24 DIAGNOSIS — E0590 Thyrotoxicosis, unspecified without thyrotoxic crisis or storm: Secondary | ICD-10-CM | POA: Diagnosis not present

## 2023-03-25 ENCOUNTER — Encounter: Payer: Self-pay | Admitting: Radiation Oncology

## 2023-03-25 ENCOUNTER — Ambulatory Visit
Admission: RE | Admit: 2023-03-25 | Discharge: 2023-03-25 | Disposition: A | Discharge status: Home / Self Care | Payer: Medicare Other | Source: Ambulatory Visit | Attending: Radiation Oncology | Admitting: Radiation Oncology

## 2023-03-25 ENCOUNTER — Encounter: Payer: Self-pay | Admitting: *Deleted

## 2023-03-25 VITALS — BP 147/79 | HR 62 | Temp 97.7°F | Resp 18 | Ht 61.0 in | Wt 164.5 lb

## 2023-03-25 DIAGNOSIS — Z170 Estrogen receptor positive status [ER+]: Secondary | ICD-10-CM | POA: Insufficient documentation

## 2023-03-25 DIAGNOSIS — M129 Arthropathy, unspecified: Secondary | ICD-10-CM | POA: Insufficient documentation

## 2023-03-25 DIAGNOSIS — Z803 Family history of malignant neoplasm of breast: Secondary | ICD-10-CM | POA: Diagnosis not present

## 2023-03-25 DIAGNOSIS — D0512 Intraductal carcinoma in situ of left breast: Secondary | ICD-10-CM | POA: Insufficient documentation

## 2023-03-25 DIAGNOSIS — E0500 Thyrotoxicosis with diffuse goiter without thyrotoxic crisis or storm: Secondary | ICD-10-CM | POA: Diagnosis not present

## 2023-03-25 NOTE — Progress Notes (Signed)
Nursing interview for Ductal carcinoma in situ (DCIS) of left breast. Patient identity verified x2.  Patient reports doing well. One steri-strip left on incision line. No issues conveyed at this time.  Meaningful use complete.  Vitals- BP (!) 147/79 (BP Location: Right Arm, Patient Position: Sitting, Cuff Size: Normal)   Pulse 62   Temp 97.7 F (36.5 C) (Temporal)   Resp 18   Ht 5\' 1"  (1.549 m)   Wt 164 lb 8 oz (74.6 kg)   SpO2 98%   BMI 31.08 kg/m   This concludes the interaction.  Ruel Favors, LPN

## 2023-03-25 NOTE — Progress Notes (Addendum)
Radiation Oncology         [phone] ________________________________  Name: Renee Smith        MRN: [id number]  Date of Service: 03/25/2023 DOB: 03-21-1946  WU:[id number], Dorris Singh, MD  Serena Croissant, MD     REFERRING PHYSICIAN: Serena Croissant, MD   DIAGNOSIS: The encounter diagnosis was Ductal carcinoma in situ (DCIS) of left breast.   HISTORY OF PRESENT ILLNESS: Renee Smith is a 77 y.o. female seen at the request of Dr. Dwain Sarna for newly diagnosed left breast cancer. She presented with an asymmetry detected on screening mammogram. She proceeded with diagnostic mammogram and ultrasound that showed calcifications spanning 1.4 cm. Patient proceeded with biopsies with one revealing intermediate grade DCIS that was ER and PR positive with the other biopsy showing an intraductal papilloma with usual ductal hyperplasia.  Since her last visit in our clinic she underwent a left lumpectomy on 03/02/2023 that showed intraductal papilloma with associated calcifications that was completely excised and 2 foci of complex sclerosing lesions also completely excised, florid usual ductal hyperplasia and fibrocystic change was noted her biopsy clips were identified within the specimen and no evidence of residual carcinoma was appreciated, the radioactive seed was removed and submitted as a specimen for gross examination only and her additional anterior margin labeled "Right" in pathology we believe is her left anterior margin based on Dr. Doreen Salvage OR notes, and the excision was negative for atypia or malignancy. She's seen to discuss adjuvant radiotherapy.    PREVIOUS RADIATION THERAPY: No   PAST MEDICAL HISTORY:  Past Medical History:  Diagnosis Date   Arthritis    Atypical mole 02/15/1996   slight-mod-left inner ankle    Atypical mole 03/11/2011   mildd-right hip   Breast cancer, left breast (HCC) 2024   Family history of breast cancer    Graves disease    in remission       PAST SURGICAL  HISTORY: Past Surgical History:  Procedure Laterality Date   BREAST BIOPSY Right 08/17/1997   BREAST BIOPSY Left 12/24/2022   MM LT BREAST BX W LOC DEV 1ST LESION IMAGE BX SPEC STEREO GUIDE 12/24/2022 [facility]   BREAST BIOPSY Left 12/24/2022   MM LT BREAST BX W LOC DEV EA AD LESION IMG BX SPEC STEREO GUIDE 12/24/2022 [facility]   BREAST BIOPSY  03/01/2023   MM LT RADIOACTIVE SEED LOC MAMMO GUIDE 03/01/2023 [facility]   BREAST EXCISIONAL BIOPSY Right 1990   BREAST LUMPECTOMY WITH RADIOACTIVE SEED LOCALIZATION Left 03/02/2023   Procedure: LEFT BREAST SEED GUIDED LUMPECTOMY;  Surgeon: Emelia Loron, MD;  Location: [facility] OR;  Service: General;  Laterality: Left;  LMA   CATARACT EXTRACTION Bilateral    CESAREAN SECTION     x2 (1975, 1978)   RETINAL DETACHMENT SURGERY     SCLERAL BUCKLE Bilateral    TUBAL LIGATION       FAMILY HISTORY:  Family History  Problem Relation Age of Onset   Breast cancer Sister        dx. early 41s, d. 82   Heart disease Maternal Grandmother    Heart attack Maternal Grandfather    Breast cancer Cousin        pat first cousin     SOCIAL HISTORY:  reports that she has never smoked. She has never used smokeless tobacco. She reports current alcohol use of about 12.0 - 14.0 standard drinks of alcohol per week. She reports that she does not use drugs. She is  married and lives in [place]. She enjoys playing bridge and working out.    ALLERGIES: Patient has no known allergies.   MEDICATIONS:  Current Outpatient Medications  Medication Sig Dispense Refill   atenolol (TENORMIN) 25 MG tablet Take 25 mg by mouth in the morning.     CALCIUM PO Take 1 tablet by mouth in the morning. (Patient not taking: Reported on 02/26/2023)     Cholecalciferol (VITAMIN D-3 PO) Take 2 tablets by mouth in the morning. (Patient not taking: Reported on 02/26/2023)     ibuprofen (ADVIL) 200 MG tablet Take 400 mg by mouth every 8 (eight) hours as needed  (pain/headache.).     methimazole (TAPAZOLE) 5 MG tablet Take 10 mg by mouth in the morning. Dose increased for upcoming surgery.     No current facility-administered medications for this encounter.     REVIEW OF SYSTEMS: On review of systems, the patient is doing well since her surgery with minimal discomfort in her recovery process. No other complaints are verbalized.     PHYSICAL EXAM:  Wt Readings from Last 3 Encounters:  03/25/23 164 lb 8 oz (74.6 kg)  03/02/23 163 lb (73.9 kg)  02/26/23 163 lb (73.9 kg)   Temp Readings from Last 3 Encounters:  03/25/23 97.7 F (36.5 C) (Temporal)  03/09/23 97.8 F (36.6 C) (Temporal)  03/02/23 97.7 F (36.5 C)   BP Readings from Last 3 Encounters:  03/25/23 (!) 147/79  03/09/23 (!) 150/70  03/02/23 122/63   Pulse Readings from Last 3 Encounters:  03/25/23 62  03/09/23 60  03/02/23 68   Pain Assessment Pain Score: 0-No pain/10  In general this is a well appearing female in no acute distress. She's alert and oriented x4 and appropriate throughout the examination. Cardiopulmonary assessment is negative for acute distress and she exhibits normal effort. Breast exam is deferred as she was seen by Dr. Dwain Sarna this morning.     ECOG = 0  0 - Asymptomatic (Fully active, able to carry on all predisease activities without restriction)  1 - Symptomatic but completely ambulatory (Restricted in physically strenuous activity but ambulatory and able to carry out work of a light or sedentary nature. For example, light housework, office work)  2 - Symptomatic, <50% in bed during the day (Ambulatory and capable of all self care but unable to carry out any work activities. Up and about more than 50% of waking hours)  3 - Symptomatic, >50% in bed, but not bedbound (Capable of only limited self-care, confined to bed or chair 50% or more of waking hours)  4 - Bedbound (Completely disabled. Cannot carry on any self-care. Totally confined to bed or  chair)  5 - Death   [name] MM, Creech RH, Tormey DC, et al. [phone]). "Toxicity and response criteria of the [facility]". Am. Evlyn Clines. Oncol. 5 (6): 649-55    LABORATORY DATA:  No results found for: "WBC", "HGB", "HCT", "MCV", "PLT" No results found for: "NA", "K", "CL", "CO2" No results found for: "ALT", "AST", "GGT", "ALKPHOS", "BILITOT"    RADIOGRAPHY: MM Breast Surgical Specimen  Addendum Date: 03/02/2023   ADDENDUM REPORT: 03/02/2023 08:18 ADDENDUM: The X shaped biopsy marking clip is also within the specimen. Electronically Signed   By: Emmaline Kluver M.D.   On: 03/02/2023 08:18   Result Date: 03/02/2023 CLINICAL DATA:  Post lumpectomy specimen radiograph EXAM: SPECIMEN RADIOGRAPH OF THE LEFT BREAST COMPARISON:  Previous exam(s). FINDINGS: Status post excision of the left breast.  The radioactive seed and coil biopsy marker clip are present, completely intact, and were marked for pathology. IMPRESSION: Specimen radiograph of the left breast. Electronically Signed: By: Emmaline Kluver M.D. On: 03/02/2023 08:13   MM LT RADIOACTIVE SEED LOC MAMMO GUIDE  Result Date: 03/01/2023 CLINICAL DATA:  77 year old female with newly diagnosed left breast intraductal papilloma and DCIS. Patient presents for seed localization. EXAM: MAMMOGRAPHIC GUIDED RADIOACTIVE SEED LOCALIZATION OF THE LEFT BREAST COMPARISON:  Previous exam(s). FINDINGS: Patient presents for radioactive seed localization prior to left breast lumpectomy. I met with the patient and we discussed the procedure of seed localization including benefits and alternatives. We discussed the high likelihood of a successful procedure. We discussed the risks of the procedure including infection, bleeding, tissue injury and further surgery. We discussed the low dose of radioactivity involved in the procedure. Informed, written consent was given. The usual time-out protocol was performed immediately prior to the procedure.  Using mammographic guidance, sterile technique, 1% lidocaine and an I-125 radioactive seed, the calcifications, coil and X biopsy clips in the left breast was localized using a medial approach. The follow-up mammogram images confirm the seed in the expected location and were marked for Dr. Dwain Sarna. Follow-up survey of the patient confirms presence of the radioactive seed. Order number of I-125 seed:  [id number]. Total activity: 0.246 mCi reference Date: February 17, 2023 The patient tolerated the procedure well and [facility] She was given instructions regarding seed removal. IMPRESSION: Radioactive seed localization left breast. No apparent complications. Electronically Signed   By: Emmaline Kluver M.D.   On: 03/01/2023 14:42       IMPRESSION/PLAN: 1. Intermediate Grade ER/PR positive DCIS of the left breast. Dr. Mitzi Hansen has reviewed her final pathology and we reviewed the nature of noninvasive breast disease. She has done well since lumpectomy. While we discussed the role of external radiotherapy to the breast  to reduce risks of local recurrence, she has very favorable features to her pathology and low volume findings from her original biopsy.  Dr. Pamelia Hoit has recommended antiestrogen therapy and given that the patient is planning to take antiestrogen therapy, Dr. Mitzi Hansen feels she may forgo radiation. She is in agreement and prefers to forgo radiation as well. She is aware that we would be happy to review this in the future if she desired to revisit this decision.   In a visit lasting 45 minutes, greater than 50% of the time was spent face to face discussing the patient's condition, in preparation for the discussion, and coordinating the patient's care.     Osker Mason, [facility]   **Disclaimer: This note was dictated with voice recognition software. Similar sounding words can inadvertently be transcribed and this note may contain transcription errors which may not have been  corrected upon publication of note.**

## 2023-03-26 NOTE — Addendum Note (Signed)
Encounter addended by: Ronny Bacon, PA-C on: 03/26/2023 6:13 AM  Actions taken: Clinical Note Signed

## 2023-04-01 ENCOUNTER — Encounter: Payer: Self-pay | Admitting: *Deleted

## 2023-04-01 DIAGNOSIS — D0512 Intraductal carcinoma in situ of left breast: Secondary | ICD-10-CM

## 2023-04-02 ENCOUNTER — Inpatient Hospital Stay: Payer: Medicare Other | Attending: Hematology and Oncology | Admitting: Hematology and Oncology

## 2023-04-02 VITALS — BP 132/58 | HR 65 | Temp 98.2°F | Resp 17 | Ht 61.0 in | Wt 164.7 lb

## 2023-04-02 DIAGNOSIS — E7800 Pure hypercholesterolemia, unspecified: Secondary | ICD-10-CM | POA: Insufficient documentation

## 2023-04-02 DIAGNOSIS — D0512 Intraductal carcinoma in situ of left breast: Secondary | ICD-10-CM | POA: Insufficient documentation

## 2023-04-02 DIAGNOSIS — M810 Age-related osteoporosis without current pathological fracture: Secondary | ICD-10-CM | POA: Insufficient documentation

## 2023-04-02 MED ORDER — TAMOXIFEN CITRATE 10 MG PO TABS: 5.0000 mg | ORAL_TABLET | Freq: Every day | ORAL | 1 refills | Status: DC

## 2023-04-02 NOTE — Assessment & Plan Note (Addendum)
12/24/2022: Screening mammogram detected left breast calcifications subareolar, 2 adjacent groups spanning 5 mm and 8 mm together 1.4 cm, ultrasound measured 4 mm. Stereotactic biopsies, left breast retroareolar: Intraductal papilloma with UDH, second biopsy retroareolar: Intermediate grade DCIS ER 75%, PR 60%   03/02/2023: Left lumpectomy: No residual DCIS.  Intraductal papilloma and CSL.   Treatment plan: Adjuvant radiation therapy: Did not receive radiation because it was felt the benefits are minimal Followed by adjuvant antiestrogen therapy with tamoxifen x 5 years (5 mg daily)   Tamoxifen counseling: We discussed the risks and benefits of tamoxifen. These include but not limited to insomnia, hot flashes, mood changes, vaginal dryness, and weight gain. Although rare, serious side effects including endometrial cancer, risk of blood clots were also discussed. We strongly believe that the benefits far outweigh the risks. Patient understands these risks and consented to starting treatment. Planned treatment duration is 5 years.  Return to clinic in 3 months for survivorship care plan visit

## 2023-04-02 NOTE — Progress Notes (Signed)
Patient Care Team: Thana Ates, MD as PCP - General (Internal Medicine) Janalyn Harder, MD (Inactive) as Consulting Physician (Dermatology) Donnelly Angelica, RN as Oncology Nurse Navigator Pershing Proud, RN as Oncology Nurse Navigator Serena Croissant, MD as Consulting Physician (Hematology and Oncology) Emelia Loron, MD as Consulting Physician (General Surgery)  DIAGNOSIS:  Encounter Diagnosis  Name Primary?   Ductal carcinoma in situ (DCIS) of left breast Yes    SUMMARY OF ONCOLOGIC HISTORY: Oncology History  Ductal carcinoma in situ (DCIS) of left breast  12/24/2022 Initial Diagnosis   Screening mammogram detected left breast calcifications subareolar, 2 adjacent groups spanning 5 mm and 8 mm together 1.4 cm, ultrasound measured 4 mm. 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    02/24/2023 Cancer Staging   Staging form: Breast, AJCC 8th Edition - Clinical: Stage 0 (cTis (DCIS), cN0, cM0, G2, ER+, PR+, HER2: Not Assessed) - Signed by Serena Croissant, MD on 02/24/2023 Stage prefix: Initial diagnosis Histologic grading system: 3 grade system     CHIEF COMPLIANT:   Discussed the use of AI scribe software for clinical note transcription with the patient, who gave verbal consent to proceed.  History of Present Illness   Renee Smith, a patient with a history of DCIS, osteoporosis, and high cholesterol, presents for a discussion about treatment options. The patient has decided against radiation therapy after discussions with multiple healthcare providers. The patient is considering hormone therapy with tamoxifen but expresses concerns about potential side effects, including hot flashes, muscle cramps, mood swings, and risk of blood clots. The patient also mentions a history of Graves' disease and is currently not on any medication for high cholesterol or osteoporosis.  The patient is interested in the potential benefits of tamoxifen on bone density and cholesterol levels but is hesitant about starting a new medication.         ALLERGIES:  has No Known Allergies.  MEDICATIONS:  Current Outpatient Medications  Medication Sig Dispense Refill   atenolol (TENORMIN) 25 MG tablet Take 25 mg by mouth in the morning.     methimazole (TAPAZOLE) 5 MG tablet Take 10 mg by mouth in the morning. Dose increased for upcoming surgery.     tamoxifen (NOLVADEX) 10 MG tablet Take 0.5 tablets (5 mg total) by mouth daily. 90 tablet 1   ibuprofen (ADVIL) 200 MG tablet Take 400 mg by mouth every 8 (eight) hours as needed (pain/headache.). (Patient not taking: Reported on 04/02/2023)     No current facility-administered medications for this visit.    PHYSICAL EXAMINATION: ECOG PERFORMANCE STATUS: 1 - Symptomatic but completely ambulatory  Vitals:   04/02/23 1002  BP: (!) 132/58  Pulse: 65  Resp: 17  Temp: 98.2 F (36.8 C)  SpO2: 98%   Filed Weights   04/02/23 1002  Weight: 164 lb 11.2 oz (74.7 kg)       ASSESSMENT & PLAN:  Ductal carcinoma in situ (DCIS) of left breast 12/24/2022: Screening mammogram detected left breast calcifications subareolar, 2 adjacent groups spanning 5 mm and 8 mm together 1.4 cm, ultrasound measured 4 mm. Stereotactic biopsies, left breast retroareolar: Intraductal papilloma with UDH, second biopsy retroareolar: Intermediate grade DCIS ER 75%, PR 60%   03/02/2023: Left lumpectomy: No residual DCIS.  Intraductal papilloma and CSL.   Treatment plan: Adjuvant radiation therapy: Did not receive radiation because it was felt the benefits are minimal Followed by adjuvant antiestrogen therapy with  tamoxifen x 5 years (5 mg daily)   Tamoxifen counseling: We discussed the risks and benefits of tamoxifen. These include but not limited to insomnia, hot flashes, mood changes, vaginal dryness, and weight gain. Although rare, serious side effects  including endometrial cancer, risk of blood clots were also discussed. We strongly believe that the benefits far outweigh the risks. Patient understands these risks and consented to starting treatment. Planned treatment duration is 5 years.  Return to clinic in 3 months for survivorship care plan visit  ------------------------------------- Assessment and Plan    DCIS Post-Surgery Discussed the benefits and risks of hormone therapy. Patient has concerns about side effects, particularly hot flashes and sleep disturbances. Discussed the COMET study and clarified its purpose and relevance to the patient's situation. -Start Tamoxifen 5mg  daily, with the understanding that the patient can stop at any time if side effects are intolerable. -Plan to reassess in 3 months.  Osteoporosis Patient has a history of osteoporosis and is not currently on medication for it. Discussed the potential bone density benefits of Tamoxifen. -Continue current management of osteoporosis, including balance exercises.  Hypercholesterolemia Patient has a history of high cholesterol, not currently on medication. Discussed the potential cholesterol-lowering benefits of Tamoxifen. -Continue current management of hypercholesterolemia.  Follow-up/Survivorship Discussed the plan for follow-up and survivorship care after the active phase of treatment.           No orders of the defined types were placed in this encounter.  The patient has a good understanding of the overall plan. she agrees with it. she will call with any problems that may develop before the next visit here. Total time spent: 30 mins including face to face time and time spent for planning, charting and co-ordination of care   Tamsen Meek, MD 04/02/23

## 2023-04-14 ENCOUNTER — Ambulatory Visit: Payer: Medicare Other | Admitting: Hematology and Oncology

## 2023-04-23 DIAGNOSIS — E0590 Thyrotoxicosis, unspecified without thyrotoxic crisis or storm: Secondary | ICD-10-CM | POA: Diagnosis not present

## 2023-04-30 DIAGNOSIS — Z23 Encounter for immunization: Secondary | ICD-10-CM | POA: Diagnosis not present

## 2023-05-07 NOTE — Plan of Care (Signed)
 [facility] Tonsillectomy/Adenoidectomy, Postoperative PEDS care plan entered in error.

## 2023-05-27 DIAGNOSIS — E0500 Thyrotoxicosis with diffuse goiter without thyrotoxic crisis or storm: Secondary | ICD-10-CM | POA: Diagnosis not present

## 2023-05-27 DIAGNOSIS — E0590 Thyrotoxicosis, unspecified without thyrotoxic crisis or storm: Secondary | ICD-10-CM | POA: Diagnosis not present

## 2023-06-07 DIAGNOSIS — E0500 Thyrotoxicosis with diffuse goiter without thyrotoxic crisis or storm: Secondary | ICD-10-CM | POA: Diagnosis not present

## 2023-06-12 DIAGNOSIS — N390 Urinary tract infection, site not specified: Secondary | ICD-10-CM | POA: Diagnosis not present

## 2023-06-17 ENCOUNTER — Inpatient Hospital Stay: Payer: Medicare Other | Attending: Hematology and Oncology | Admitting: Adult Health

## 2023-06-17 ENCOUNTER — Encounter: Payer: Self-pay | Admitting: Adult Health

## 2023-06-17 VITALS — BP 146/71 | HR 69 | Temp 97.6°F | Resp 18 | Wt 166.1 lb

## 2023-06-17 DIAGNOSIS — D0512 Intraductal carcinoma in situ of left breast: Secondary | ICD-10-CM | POA: Diagnosis not present

## 2023-06-17 DIAGNOSIS — E0500 Thyrotoxicosis with diffuse goiter without thyrotoxic crisis or storm: Secondary | ICD-10-CM | POA: Insufficient documentation

## 2023-06-17 DIAGNOSIS — E042 Nontoxic multinodular goiter: Secondary | ICD-10-CM | POA: Insufficient documentation

## 2023-06-17 DIAGNOSIS — M810 Age-related osteoporosis without current pathological fracture: Secondary | ICD-10-CM | POA: Insufficient documentation

## 2023-06-17 DIAGNOSIS — E7800 Pure hypercholesterolemia, unspecified: Secondary | ICD-10-CM | POA: Insufficient documentation

## 2023-06-17 NOTE — Progress Notes (Signed)
SURVIVORSHIP VISIT:  BRIEF ONCOLOGIC HISTORY:  Oncology History  Ductal carcinoma in situ (DCIS) of left breast  12/24/2022 Initial Diagnosis   Screening mammogram detected left breast calcifications subareolar, 2 adjacent groups spanning 5 mm and 8 mm together 1.4 cm, ultrasound measured 4 mm. 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    02/24/2023 Cancer Staging   Staging form: Breast, AJCC 8th Edition - Clinical: Stage 0 (cTis (DCIS), cN0, cM0, G2, ER+, PR+, HER2: Not Assessed) - Signed by Serena Croissant, MD on 02/24/2023 Stage prefix: Initial diagnosis Histologic grading system: 3 grade system   03/02/2023 Surgery   Left lumpectomy: No residual DCIS.  Intraductal papilloma and CSL.    03/10/2023 Genetic Testing   Negative. Genes tested include: AIP, ALK, APC*, ATM*, AXIN2, BAP1, BARD1, BMPR1A, BRCA1*, BRCA2*, BRIP1*, CDC73, CDH1*, CDK4, CDKN1B, CDKN2A, CHEK2*, CTNNA1, DICER1, FH, FLCN, KIF1B, LZTR1, MAX, MEN1, MET, MLH1*, MSH2*, MSH3, MSH6*, MUTYH*, NF1*, NF2, NTHL1, PALB2*, PHOX2B, PMS2*, POT1, PRKAR1A, PTCH1, PTEN*, RAD51C*, RAD51D*, RB1, RET, SDHA, SDHAF2, SDHB, SDHC, SDHD, SMAD4, SMARCA4, SMARCB1, SMARCE1, STK11, SUFU, TMEM127, TP53*, TSC1, TSC2, and VHL (sequencing and deletion/duplication); EGFR, EGLN1, HOXB13, KIT, MITF, PDGFRA, POLD1, and POLE (sequencing only); EPCAM and GREM1 (deletion/duplication only). DNA and RNA analyses performed for * genes.    04/2023 -  Anti-estrogen oral therapy   Tamoxifen x 5 years     INTERVAL HISTORY:  Renee Smith to review her survivorship care plan detailing her treatment course for breast cancer, as well as monitoring long-term side effects of that treatment, education regarding health maintenance, screening, and overall wellness and health promotion.     Renee Smith has some questions about Tamoxifen and expresses uncertainty about  the necessity of continuing it, given her age and the low risk of recurrence due to the noninvasive nature of her cancer. The patient reports a decision to temporarily discontinue tamoxifen, citing concerns about potential side effects and weight gain, although she acknowledges the possibility that stress and dietary habits may be contributing factors.  The patient is currently dealing with significant stress related to her spouse's health. Her spouse has recently been diagnosed with Fragile X syndrome, leading to mobility issues, cognitive decline, and tremors. The patient is actively involved in coordinating her spouse's care which has been further complicated by the geographical distance of her children, who live in various locations across the country.  Renee Smith maintains an active lifestyle, with daily exercise either at the gym or walking her dog. She expresses a willingness to make small lifestyle changes to improve her health, such as reducing alcohol intake and improving her diet.  REVIEW OF SYSTEMS:  Review of Systems  Constitutional:  Negative for appetite change, chills, fatigue, fever and unexpected weight change.  HENT:   Negative for hearing loss, lump/mass and trouble swallowing.   Eyes:  Negative for eye problems and icterus.  Respiratory:  Negative for chest tightness, cough and shortness of breath.   Cardiovascular:  Negative for chest pain, leg swelling and palpitations.  Gastrointestinal:  Negative for abdominal distention, abdominal pain, constipation, diarrhea, nausea and vomiting.  Endocrine: Negative for hot flashes.  Genitourinary:  Negative for difficulty urinating.   Musculoskeletal:  Negative for arthralgias.  Skin:  Negative for itching and rash.  Neurological:  Negative for dizziness, extremity weakness, headaches and numbness.  Hematological:  Negative for adenopathy. Does not bruise/bleed easily.  Psychiatric/Behavioral:  Negative for depression. The patient is not  nervous/anxious.   Breast: Denies any new nodularity, masses, tenderness, nipple changes, or nipple discharge.       PAST MEDICAL/SURGICAL HISTORY:  Past Medical History:  Diagnosis Date   Arthritis    Atypical mole 02/15/1996   slight-mod-left inner ankle    Atypical mole 03/11/2011   mildd-right hip   Breast cancer, left breast (HCC) 2024   Family history of breast cancer    Graves disease    in remission   Past Surgical History:  Procedure Laterality Date   BREAST BIOPSY Right 08/17/1997   BREAST BIOPSY Left 12/24/2022   MM LT BREAST BX W LOC DEV 1ST LESION IMAGE BX SPEC STEREO GUIDE 12/24/2022 [facility]   BREAST BIOPSY Left 12/24/2022   MM LT BREAST BX W LOC DEV EA AD LESION IMG BX SPEC STEREO GUIDE 12/24/2022 [facility]   BREAST BIOPSY  03/01/2023   MM LT RADIOACTIVE SEED LOC MAMMO GUIDE 03/01/2023 [facility]   BREAST EXCISIONAL BIOPSY Right 1990   BREAST LUMPECTOMY WITH RADIOACTIVE SEED LOCALIZATION Left 03/02/2023   Procedure: LEFT BREAST SEED GUIDED LUMPECTOMY;  Surgeon: Emelia Loron, MD;  Location: [facility] OR;  Service: General;  Laterality: Left;  LMA   CATARACT EXTRACTION Bilateral    CESAREAN SECTION     x2 (1975, 1978)   RETINAL DETACHMENT SURGERY     SCLERAL BUCKLE Bilateral    TUBAL LIGATION       ALLERGIES:  No Known Allergies   CURRENT MEDICATIONS:  Outpatient Encounter Medications as of 06/17/2023  Medication Sig   ibuprofen (ADVIL) 200 MG tablet Take 400 mg by mouth every 8 (eight) hours as needed (pain/headache.).   methimazole (TAPAZOLE) 5 MG tablet Take 10 mg by mouth in the morning. Dose increased for upcoming surgery.   tamoxifen (NOLVADEX) 10 MG tablet Take 0.5 tablets (5 mg total) by mouth daily.   [DISCONTINUED] atenolol (TENORMIN) 25 MG tablet Take 25 mg by mouth in the morning.   No facility-administered encounter medications on file as of 06/17/2023.     ONCOLOGIC FAMILY HISTORY:  Family History  Problem  Relation Age of Onset   Breast cancer Sister        dx. early 104s, d. 31   Heart disease Maternal Grandmother    Heart attack Maternal Grandfather    Breast cancer Cousin        pat first cousin     SOCIAL HISTORY:  Social History   Socioeconomic History   Marital status: Married    Spouse name: Not on file   Number of children: 3   Years of education: Not on file   Highest education level: Not on file  Occupational History   Not on file  Tobacco Use   Smoking status: Never   Smokeless tobacco: Never  Vaping Use   Vaping status: Never Used  Substance and Sexual Activity   Alcohol use: Yes    Alcohol/week: 12.0 - 14.0 standard drinks of alcohol    Types: 12 - 14 Glasses of wine per week    Comment: 1.5-2 glasses of wine/day   Drug use: Never   Sexual activity: Not on file  Other Topics Concern   Not on file  Social History Narrative   Not on file   Social Determinants of Health   Financial Resource Strain: Not on file  Food Insecurity: No Food Insecurity (02/26/2023)   Hunger Vital Sign    Worried About Running Out of Food in the Last Year: Never true  Ran Out of Food in the Last Year: Never true  Transportation Needs: No Transportation Needs (02/26/2023)   PRAPARE - Administrator, Civil Service (Medical): No    Lack of Transportation (Non-Medical): No  Physical Activity: Not on file  Stress: Not on file  Social Connections: Not on file  Intimate Partner Violence: Not At Risk (02/26/2023)   Humiliation, Afraid, Rape, and Kick questionnaire    Fear of Current or Ex-Partner: No    Emotionally Abused: No    Physically Abused: No    Sexually Abused: No     OBSERVATIONS/OBJECTIVE:  BP (!) 146/71 (BP Location: Left Arm, Patient Position: Sitting)   Pulse 69   Temp 97.6 F (36.4 C) (Temporal)   Resp 18   Wt 166 lb 1 oz (75.3 kg)   SpO2 100%   BMI 31.38 kg/m  GENERAL: Patient is a well appearing female in no acute distress HEENT:  Sclerae  anicteric.  Oropharynx clear and moist. No ulcerations or evidence of oropharyngeal candidiasis. Neck is supple.  NODES:  No cervical, supraclavicular, or axillary lymphadenopathy palpated.  BREAST EXAM: Left breast status postlumpectomy no sign of local recurrence right breast is benign LUNGS:  Clear to auscultation bilaterally.  No wheezes or rhonchi. HEART:  Regular rate and rhythm. No murmur appreciated. ABDOMEN:  Soft, nontender.  Positive, normoactive bowel sounds. No organomegaly palpated. MSK:  No focal spinal tenderness to palpation. Full range of motion bilaterally in the upper extremities. EXTREMITIES:  No peripheral edema.   SKIN:  Clear with no obvious rashes or skin changes. No nail dyscrasia. NEURO:  Nonfocal. Well oriented.  Appropriate affect.   LABORATORY DATA:  None for this visit.  DIAGNOSTIC IMAGING:  None for this visit.      ASSESSMENT AND PLAN:  Renee Smith is a pleasant 77 y.o. 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  She presents to the Survivorship Clinic for our initial meeting and routine follow-up post-completion of treatment for breast cancer.    1. Stage 0 left breast cancer:  Renee Smith is continuing to recover from definitive treatment for breast cancer. She will follow-up with her medical oncologist, Dr.  Pamelia Hoit with history and physical exam per surveillance protocol.  She will continue her anti-estrogen therapy with Tamoxifen. Thus far, she is tolerating the Tamoxifen well, with minimal side effects. Her mammogram is due 12/2023; orders placed today.   Today, a comprehensive survivorship care plan and treatment summary was reviewed with the patient today detailing her breast cancer diagnosis, treatment course, potential late/long-term effects of treatment, appropriate follow-up care with recommendations for the  future, and patient education resources.  A copy of this summary, along with a letter will be sent to the patient's primary care provider via mail/fax/In Basket message after today's visit.    2. Bone health:  She was given education on specific activities to promote bone health.  3. Cancer screening:  Due to Renee Smith's history and her age, she should receive screening for skin cancers.  The information and recommendations are listed on the patient's comprehensive care plan/treatment summary and were reviewed in detail with the patient.    4. Health maintenance and wellness promotion: Renee Smith was encouraged to consume 5-7 servings of fruits and vegetables per day. We reviewed the "Nutrition Rainbow" handout.  She was also encouraged to engage in moderate to vigorous exercise for 30  minutes per day most days of the week.  She was instructed to limit her alcohol consumption and continue to abstain from tobacco use.     5. Support services/counseling: It is not uncommon for this period of the patient's cancer care trajectory to be one of many emotions and stressors.   She was given information regarding our available services and encouraged to contact me with any questions or for help enrolling in any of our support group/programs.    Follow up instructions:    -Return to cancer center in 1 year for f/u with Dr. Pamelia Hoit  -Mammogram due in 12/2023 -Follow up with Dr. Dwain Sarna in 6 months -She is welcome to return back to the Survivorship Clinic at any time; no additional follow-up needed at this time.  -Consider referral back to survivorship as a long-term survivor for continued surveillance  The patient was provided an opportunity to ask questions and all were answered. The patient agreed with the plan and demonstrated an understanding of the instructions.   Total encounter time:40 minutes*in face-to-face visit time, chart review, lab review, care coordination, order entry, and documentation of  the encounter time.    Lillard Anes, NP 06/17/23 1:30 PM Medical Oncology and Hematology [facility] [address] [place], [place] [address] Tel. [phone]    Fax. [phone]  *Total Encounter Time as defined by the Centers for Medicare and Medicaid Services includes, in addition to the face-to-face time of a patient visit (documented in the note above) non-face-to-face time: obtaining and reviewing outside history, ordering and reviewing medications, tests or procedures, care coordination (communications with other health care professionals or caregivers) and documentation in the medical record.

## 2023-06-29 DIAGNOSIS — Z01411 Encounter for gynecological examination (general) (routine) with abnormal findings: Secondary | ICD-10-CM | POA: Diagnosis not present

## 2023-06-29 DIAGNOSIS — L292 Pruritus vulvae: Secondary | ICD-10-CM | POA: Diagnosis not present

## 2023-06-29 DIAGNOSIS — Z124 Encounter for screening for malignant neoplasm of cervix: Secondary | ICD-10-CM | POA: Diagnosis not present

## 2023-06-29 DIAGNOSIS — R234 Changes in skin texture: Secondary | ICD-10-CM | POA: Diagnosis not present

## 2023-06-29 DIAGNOSIS — Z01419 Encounter for gynecological examination (general) (routine) without abnormal findings: Secondary | ICD-10-CM | POA: Diagnosis not present

## 2023-06-30 ENCOUNTER — Other Ambulatory Visit: Payer: Self-pay | Admitting: Obstetrics

## 2023-06-30 DIAGNOSIS — E2839 Other primary ovarian failure: Secondary | ICD-10-CM

## 2023-07-29 DIAGNOSIS — N952 Postmenopausal atrophic vaginitis: Secondary | ICD-10-CM | POA: Diagnosis not present

## 2023-07-29 DIAGNOSIS — L292 Pruritus vulvae: Secondary | ICD-10-CM | POA: Diagnosis not present

## 2023-08-16 DIAGNOSIS — E0590 Thyrotoxicosis, unspecified without thyrotoxic crisis or storm: Secondary | ICD-10-CM | POA: Diagnosis not present

## 2023-09-04 DIAGNOSIS — J101 Influenza due to other identified influenza virus with other respiratory manifestations: Secondary | ICD-10-CM | POA: Diagnosis not present

## 2023-09-04 DIAGNOSIS — R051 Acute cough: Secondary | ICD-10-CM | POA: Diagnosis not present

## 2023-09-20 DIAGNOSIS — E0500 Thyrotoxicosis with diffuse goiter without thyrotoxic crisis or storm: Secondary | ICD-10-CM | POA: Diagnosis not present

## 2023-09-20 DIAGNOSIS — E0590 Thyrotoxicosis, unspecified without thyrotoxic crisis or storm: Secondary | ICD-10-CM | POA: Diagnosis not present

## 2023-10-21 DIAGNOSIS — E0590 Thyrotoxicosis, unspecified without thyrotoxic crisis or storm: Secondary | ICD-10-CM | POA: Diagnosis not present

## 2023-11-01 ENCOUNTER — Encounter: Payer: Self-pay | Admitting: Genetic Counselor

## 2023-11-12 DIAGNOSIS — Z79899 Other long term (current) drug therapy: Secondary | ICD-10-CM | POA: Diagnosis not present

## 2023-11-12 DIAGNOSIS — Z853 Personal history of malignant neoplasm of breast: Secondary | ICD-10-CM | POA: Diagnosis not present

## 2023-11-12 DIAGNOSIS — R300 Dysuria: Secondary | ICD-10-CM | POA: Diagnosis not present

## 2023-11-12 DIAGNOSIS — E559 Vitamin D deficiency, unspecified: Secondary | ICD-10-CM | POA: Diagnosis not present

## 2023-11-12 DIAGNOSIS — E0500 Thyrotoxicosis with diffuse goiter without thyrotoxic crisis or storm: Secondary | ICD-10-CM | POA: Diagnosis not present

## 2023-11-12 DIAGNOSIS — Z0000 Encounter for general adult medical examination without abnormal findings: Secondary | ICD-10-CM | POA: Diagnosis not present

## 2023-11-12 DIAGNOSIS — M810 Age-related osteoporosis without current pathological fracture: Secondary | ICD-10-CM | POA: Diagnosis not present

## 2023-11-12 DIAGNOSIS — E7800 Pure hypercholesterolemia, unspecified: Secondary | ICD-10-CM | POA: Diagnosis not present

## 2023-11-12 DIAGNOSIS — R748 Abnormal levels of other serum enzymes: Secondary | ICD-10-CM | POA: Diagnosis not present

## 2023-12-02 DIAGNOSIS — R300 Dysuria: Secondary | ICD-10-CM | POA: Diagnosis not present

## 2023-12-16 DIAGNOSIS — R300 Dysuria: Secondary | ICD-10-CM | POA: Diagnosis not present

## 2023-12-20 DIAGNOSIS — E0500 Thyrotoxicosis with diffuse goiter without thyrotoxic crisis or storm: Secondary | ICD-10-CM | POA: Diagnosis not present

## 2023-12-21 ENCOUNTER — Ambulatory Visit
Admission: RE | Admit: 2023-12-21 | Discharge: 2023-12-21 | Disposition: A | Discharge status: Home / Self Care | Source: Ambulatory Visit | Attending: Adult Health | Admitting: Adult Health

## 2023-12-21 DIAGNOSIS — D0512 Intraductal carcinoma in situ of left breast: Secondary | ICD-10-CM

## 2023-12-21 DIAGNOSIS — Z853 Personal history of malignant neoplasm of breast: Secondary | ICD-10-CM | POA: Diagnosis not present

## 2023-12-21 DIAGNOSIS — Z08 Encounter for follow-up examination after completed treatment for malignant neoplasm: Secondary | ICD-10-CM | POA: Diagnosis not present

## 2023-12-30 ENCOUNTER — Other Ambulatory Visit: Payer: Self-pay | Admitting: Obstetrics

## 2023-12-30 DIAGNOSIS — E2839 Other primary ovarian failure: Secondary | ICD-10-CM

## 2024-01-20 DIAGNOSIS — D0512 Intraductal carcinoma in situ of left breast: Secondary | ICD-10-CM | POA: Diagnosis not present

## 2024-01-31 DIAGNOSIS — E0590 Thyrotoxicosis, unspecified without thyrotoxic crisis or storm: Secondary | ICD-10-CM | POA: Diagnosis not present

## 2024-02-01 DIAGNOSIS — L570 Actinic keratosis: Secondary | ICD-10-CM | POA: Diagnosis not present

## 2024-02-01 DIAGNOSIS — L988 Other specified disorders of the skin and subcutaneous tissue: Secondary | ICD-10-CM | POA: Diagnosis not present

## 2024-02-01 DIAGNOSIS — L821 Other seborrheic keratosis: Secondary | ICD-10-CM | POA: Diagnosis not present

## 2024-02-01 DIAGNOSIS — D1801 Hemangioma of skin and subcutaneous tissue: Secondary | ICD-10-CM | POA: Diagnosis not present

## 2024-02-01 DIAGNOSIS — L738 Other specified follicular disorders: Secondary | ICD-10-CM | POA: Diagnosis not present

## 2024-03-09 ENCOUNTER — Other Ambulatory Visit: Payer: Medicare Other

## 2024-04-21 DIAGNOSIS — Z23 Encounter for immunization: Secondary | ICD-10-CM | POA: Diagnosis not present

## 2024-05-01 ENCOUNTER — Other Ambulatory Visit: Payer: Self-pay | Admitting: Hematology and Oncology

## 2024-06-19 DIAGNOSIS — E0500 Thyrotoxicosis with diffuse goiter without thyrotoxic crisis or storm: Secondary | ICD-10-CM | POA: Diagnosis not present

## 2024-06-21 ENCOUNTER — Inpatient Hospital Stay: Payer: Medicare Other | Attending: Hematology and Oncology | Admitting: Hematology and Oncology

## 2024-06-21 VITALS — BP 145/79 | HR 85 | Temp 98.4°F | Resp 18 | Wt 157.6 lb

## 2024-06-21 DIAGNOSIS — Z923 Personal history of irradiation: Secondary | ICD-10-CM | POA: Insufficient documentation

## 2024-06-21 DIAGNOSIS — D0512 Intraductal carcinoma in situ of left breast: Secondary | ICD-10-CM

## 2024-06-21 DIAGNOSIS — Z170 Estrogen receptor positive status [ER+]: Secondary | ICD-10-CM | POA: Diagnosis not present

## 2024-06-21 DIAGNOSIS — M810 Age-related osteoporosis without current pathological fracture: Secondary | ICD-10-CM | POA: Insufficient documentation

## 2024-06-21 DIAGNOSIS — N951 Menopausal and female climacteric states: Secondary | ICD-10-CM | POA: Insufficient documentation

## 2024-06-21 DIAGNOSIS — Z1721 Progesterone receptor positive status: Secondary | ICD-10-CM | POA: Diagnosis not present

## 2024-06-21 NOTE — Progress Notes (Signed)
 Patient Care Team: Dwight Trula SQUIBB, MD as PCP - General (Internal Medicine) Odean Potts, MD as Consulting Physician (Hematology and Oncology) Ebbie Cough, MD as Consulting Physician (General Surgery) Faythe Purchase, MD as Consulting Physician (Endocrinology)  DIAGNOSIS:  Encounter Diagnosis  Name Primary?   Ductal carcinoma in situ (DCIS) of left breast Yes    SUMMARY OF ONCOLOGIC HISTORY: Oncology History  Ductal carcinoma in situ (DCIS) of left breast  12/24/2022 Initial Diagnosis   Screening mammogram detected left breast calcifications subareolar, 2 adjacent groups spanning 5 mm and 8 mm together 1.4 cm, ultrasound measured 4 mm. 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    02/24/2023 Cancer Staging   Staging form: Breast, AJCC 8th Edition - Clinical: Stage 0 (cTis (DCIS), cN0, cM0, G2, ER+, PR+, HER2: Not Assessed) - Signed by Odean Potts, MD on 02/24/2023 Stage prefix: Initial diagnosis Histologic grading system: 3 grade system   03/02/2023 Surgery   Left lumpectomy: No residual DCIS.  Intraductal papilloma and CSL.    03/10/2023 Genetic Testing   Negative. Genes tested include: AIP, ALK, APC*, ATM*, AXIN2, BAP1, BARD1, BMPR1A, BRCA1*, BRCA2*, BRIP1*, CDC73, CDH1*, CDK4, CDKN1B, CDKN2A, CHEK2*, CTNNA1, DICER1, FH, FLCN, KIF1B, LZTR1, MAX, MEN1, MET, MLH1*, MSH2*, MSH3, MSH6*, MUTYH*, NF1*, NF2, NTHL1, PALB2*, PHOX2B, PMS2*, POT1, PRKAR1A, PTCH1, PTEN*, RAD51C*, RAD51D*, RB1, RET, SDHA, SDHAF2, SDHB, SDHC, SDHD, SMAD4, SMARCA4, SMARCB1, SMARCE1, STK11, SUFU, TMEM127, TP53*, TSC1, TSC2, and VHL (sequencing and deletion/duplication); EGFR, EGLN1, HOXB13, KIT, MITF, PDGFRA, POLD1, and POLE (sequencing only); EPCAM and GREM1 (deletion/duplication only). DNA and RNA analyses performed for * genes.    04/2023 -  Anti-estrogen oral therapy   Tamoxifen  x 5  years     CHIEF COMPLIANT: Surveillance of breast cancer on tamoxifen   HISTORY OF PRESENT ILLNESS:   History of Present Illness Renee Smith is a 78 year old female with ductal carcinoma in situ of the left breast who presents for routine oncology follow-up and surveillance.  She was initially started on tamoxifen  for DCIS, briefly stopped it, then restarted after discussion with her oncologist and has taken half a tablet daily since January 20, 2024. She notes mild intermittent hot flashes and slight hair thinning, with no more than three hot flashes per day and no side effects she considers bothersome. Tamoxifen  is her only regular medication.  She is up to date with surveillance mammograms, which have been unremarkable.  She has osteoporosis and previously completed Fosamax. She has a bone density scan scheduled for August 01, 2024 and is anxious about the results. She reports a recent fall without fracture or significant injury and continues osteoporosis management with Dr. Purchase Faythe.     ALLERGIES:  has no known allergies.  MEDICATIONS:  Current Outpatient Medications  Medication Sig Dispense Refill   ibuprofen (ADVIL) 200 MG tablet Take 400 mg by mouth every 8 (eight) hours as needed (pain/headache.).     tamoxifen  (NOLVADEX ) 10 MG tablet TAKE 1/2 TABLET BY MOUTH DAILY 45 tablet 0   No current facility-administered medications for this visit.    PHYSICAL EXAMINATION: ECOG PERFORMANCE STATUS: 1 - Symptomatic but completely ambulatory  Vitals:   06/21/24 0854  BP: (!) 145/79  Pulse: 85  Resp: 18  Temp: 98.4 F (36.9 C)  SpO2: 95%   Filed Weights   06/21/24 0854  Weight: 157 lb 9.6 oz (71.5 kg)    Physical Exam Breast exam: No palpable lumps  marks of bilateral breast or axilla   BREAST: No palpable masses in breasts bilaterally.  (exam performed in the presence of a chaperone)   ASSESSMENT & PLAN:  Ductal carcinoma in situ (DCIS) of left  breast 12/24/2022: Screening mammogram detected left breast calcifications subareolar, 2 adjacent groups spanning 5 mm and 8 mm together 1.4 cm, ultrasound measured 4 mm. Stereotactic biopsies, left breast retroareolar: Intraductal papilloma with UDH, second biopsy retroareolar: Intermediate grade DCIS ER 75%, PR 60%   03/02/2023: Left lumpectomy: No residual DCIS.  Intraductal papilloma and CSL.   Treatment plan: Adjuvant radiation therapy: Did not receive radiation because it was felt the benefits are minimal Followed by adjuvant antiestrogen therapy with tamoxifen  x 5 years (5 mg daily) started October 2024   Tamoxifen  toxicities: Does not have any major side effects.  Mild hot flashes intermittently and slight hair thinning. She did not take tamoxifen  for several months but restarted it after seeing Dr. Ebbie in July 2025  Breast cancer surveillance: Breast exam 06/21/2024: Benign Mammogram 12/21/2023: Benign breast density category B  Return to clinic in 1 year for follow-up  No orders of the defined types were placed in this encounter.  The patient has a good understanding of the overall plan. she agrees with it. she will call with any problems that may develop before the next visit here.  I personally spent a total of 30 minutes in the care of the patient today including preparing to see the patient, getting/reviewing separately obtained history, performing a medically appropriate exam/evaluation, counseling and educating, placing orders, referring and communicating with other health care professionals, documenting clinical information in the EHR, independently interpreting results, communicating results, and coordinating care.   Viinay K Avenly Roberge, MD 06/21/24

## 2024-06-21 NOTE — Assessment & Plan Note (Signed)
 12/24/2022: Screening mammogram detected left breast calcifications subareolar, 2 adjacent groups spanning 5 mm and 8 mm together 1.4 cm, ultrasound measured 4 mm. Stereotactic biopsies, left breast retroareolar: Intraductal papilloma with UDH, second biopsy retroareolar: Intermediate grade DCIS ER 75%, PR 60%   03/02/2023: Left lumpectomy: No residual DCIS.  Intraductal papilloma and CSL.   Treatment plan: Adjuvant radiation therapy: Did not receive radiation because it was felt the benefits are minimal Followed by adjuvant antiestrogen therapy with tamoxifen  x 5 years (5 mg daily) started October 2024   Tamoxifen  toxicities:  Breast cancer surveillance: Breast exam 06/21/2024: Benign Mammogram 12/21/2023: Benign breast density category B  Return to clinic in 1 year for follow-up

## 2024-08-01 ENCOUNTER — Ambulatory Visit (HOSPITAL_BASED_OUTPATIENT_CLINIC_OR_DEPARTMENT_OTHER)
Admission: RE | Admit: 2024-08-01 | Discharge: 2024-08-01 | Disposition: A | Discharge status: Home / Self Care | Source: Ambulatory Visit | Attending: Obstetrics | Admitting: Obstetrics

## 2024-08-01 DIAGNOSIS — E2839 Other primary ovarian failure: Secondary | ICD-10-CM | POA: Insufficient documentation

## 2025-06-21 ENCOUNTER — Inpatient Hospital Stay: Admitting: Hematology and Oncology
